# Patient Record
Sex: Male | Born: 1940 | Race: Black or African American | Hispanic: No | Smoking: Never smoker
Health system: Southern US, Community
[De-identification: ages and names within clinical notes are randomized; demographics above are authoritative.]

## PROBLEM LIST (undated history)

## (undated) DIAGNOSIS — D649 Anemia, unspecified: Secondary | ICD-10-CM

## (undated) DIAGNOSIS — I2699 Other pulmonary embolism without acute cor pulmonale: Secondary | ICD-10-CM

## (undated) DIAGNOSIS — I10 Essential (primary) hypertension: Secondary | ICD-10-CM

## (undated) DIAGNOSIS — C801 Malignant (primary) neoplasm, unspecified: Secondary | ICD-10-CM

## (undated) DIAGNOSIS — C61 Malignant neoplasm of prostate: Secondary | ICD-10-CM

## (undated) HISTORY — PX: HERNIA REPAIR: SHX51

## (undated) HISTORY — PX: COLONOSCOPY: SHX174

---

## 2002-02-10 ENCOUNTER — Encounter: Admission: RE | Admit: 2002-02-10 | Discharge: 2002-02-10 | Payer: Self-pay | Admitting: Occupational Medicine

## 2002-02-10 ENCOUNTER — Encounter: Payer: Self-pay | Admitting: Occupational Medicine

## 2016-09-08 ENCOUNTER — Encounter (HOSPITAL_BASED_OUTPATIENT_CLINIC_OR_DEPARTMENT_OTHER): Payer: Self-pay | Admitting: Emergency Medicine

## 2016-09-08 ENCOUNTER — Inpatient Hospital Stay (HOSPITAL_BASED_OUTPATIENT_CLINIC_OR_DEPARTMENT_OTHER)
Admission: EM | Admit: 2016-09-08 | Discharge: 2016-09-12 | DRG: 175 | Disposition: A | Discharge status: Home / Self Care | Payer: Medicare Other | Attending: Internal Medicine | Admitting: Internal Medicine

## 2016-09-08 ENCOUNTER — Emergency Department (HOSPITAL_BASED_OUTPATIENT_CLINIC_OR_DEPARTMENT_OTHER): Payer: Medicare Other

## 2016-09-08 DIAGNOSIS — M1990 Unspecified osteoarthritis, unspecified site: Secondary | ICD-10-CM | POA: Diagnosis present

## 2016-09-08 DIAGNOSIS — I420 Dilated cardiomyopathy: Secondary | ICD-10-CM | POA: Diagnosis present

## 2016-09-08 DIAGNOSIS — E876 Hypokalemia: Secondary | ICD-10-CM | POA: Diagnosis not present

## 2016-09-08 DIAGNOSIS — J9601 Acute respiratory failure with hypoxia: Secondary | ICD-10-CM | POA: Diagnosis not present

## 2016-09-08 DIAGNOSIS — D62 Acute posthemorrhagic anemia: Secondary | ICD-10-CM | POA: Diagnosis present

## 2016-09-08 DIAGNOSIS — R0902 Hypoxemia: Secondary | ICD-10-CM | POA: Diagnosis not present

## 2016-09-08 DIAGNOSIS — R000 Tachycardia, unspecified: Secondary | ICD-10-CM | POA: Diagnosis not present

## 2016-09-08 DIAGNOSIS — Z8546 Personal history of malignant neoplasm of prostate: Secondary | ICD-10-CM

## 2016-09-08 DIAGNOSIS — Z923 Personal history of irradiation: Secondary | ICD-10-CM | POA: Diagnosis not present

## 2016-09-08 DIAGNOSIS — I10 Essential (primary) hypertension: Secondary | ICD-10-CM | POA: Diagnosis not present

## 2016-09-08 DIAGNOSIS — Z79899 Other long term (current) drug therapy: Secondary | ICD-10-CM | POA: Diagnosis not present

## 2016-09-08 DIAGNOSIS — I2699 Other pulmonary embolism without acute cor pulmonale: Secondary | ICD-10-CM | POA: Diagnosis not present

## 2016-09-08 DIAGNOSIS — R0602 Shortness of breath: Secondary | ICD-10-CM | POA: Diagnosis not present

## 2016-09-08 DIAGNOSIS — Z86711 Personal history of pulmonary embolism: Secondary | ICD-10-CM

## 2016-09-08 DIAGNOSIS — I5022 Chronic systolic (congestive) heart failure: Secondary | ICD-10-CM | POA: Diagnosis not present

## 2016-09-08 DIAGNOSIS — I110 Hypertensive heart disease with heart failure: Secondary | ICD-10-CM | POA: Diagnosis present

## 2016-09-08 DIAGNOSIS — D649 Anemia, unspecified: Secondary | ICD-10-CM | POA: Diagnosis not present

## 2016-09-08 HISTORY — DX: Malignant (primary) neoplasm, unspecified: C80.1

## 2016-09-08 HISTORY — DX: Essential (primary) hypertension: I10

## 2016-09-08 HISTORY — DX: Other pulmonary embolism without acute cor pulmonale: I26.99

## 2016-09-08 LAB — PROTIME-INR
INR: 1.28
PROTHROMBIN TIME: 16.1 s — AB (ref 11.4–15.2)

## 2016-09-08 LAB — CBC WITH DIFFERENTIAL/PLATELET
BASOS ABS: 0 10*3/uL (ref 0.0–0.1)
Basophils Relative: 0 %
EOS ABS: 0.2 10*3/uL (ref 0.0–0.7)
Eosinophils Relative: 2 %
HCT: 27.5 % — ABNORMAL LOW (ref 39.0–52.0)
HEMOGLOBIN: 8.8 g/dL — AB (ref 13.0–17.0)
LYMPHS PCT: 21 %
Lymphs Abs: 1.6 10*3/uL (ref 0.7–4.0)
MCH: 21.5 pg — ABNORMAL LOW (ref 26.0–34.0)
MCHC: 32 g/dL (ref 30.0–36.0)
MCV: 67.1 fL — ABNORMAL LOW (ref 78.0–100.0)
MONO ABS: 1 10*3/uL (ref 0.1–1.0)
Monocytes Relative: 13 %
NEUTROS ABS: 4.8 10*3/uL (ref 1.7–7.7)
NEUTROS PCT: 64 %
PLATELETS: 315 10*3/uL (ref 150–400)
RBC: 4.1 MIL/uL — ABNORMAL LOW (ref 4.22–5.81)
RDW: 17.5 % — ABNORMAL HIGH (ref 11.5–15.5)
WBC: 7.6 10*3/uL (ref 4.0–10.5)

## 2016-09-08 LAB — BASIC METABOLIC PANEL
ANION GAP: 8 (ref 5–15)
BUN: 16 mg/dL (ref 6–20)
CALCIUM: 9.8 mg/dL (ref 8.9–10.3)
CO2: 21 mmol/L — ABNORMAL LOW (ref 22–32)
Chloride: 107 mmol/L (ref 101–111)
Creatinine, Ser: 1.08 mg/dL (ref 0.61–1.24)
GFR calc Af Amer: >60 mL/min (ref >60)
Glucose, Bld: 152 mg/dL — ABNORMAL HIGH (ref 65–99)
Potassium: 3.7 mmol/L (ref 3.5–5.1)
SODIUM: 136 mmol/L (ref 135–145)

## 2016-09-08 LAB — GLUCOSE, CAPILLARY: GLUCOSE-CAPILLARY: 108 mg/dL — AB (ref 65–99)

## 2016-09-08 LAB — PSA: PSA: 0.02 ng/mL (ref 0.00–4.00)

## 2016-09-08 LAB — HEMOGLOBIN AND HEMATOCRIT, BLOOD
HEMATOCRIT: 26.4 % — AB (ref 39.0–52.0)
HEMOGLOBIN: 8.2 g/dL — AB (ref 13.0–17.0)

## 2016-09-08 LAB — HEPARIN LEVEL (UNFRACTIONATED): Heparin Unfractionated: 0.51 IU/mL (ref 0.30–0.70)

## 2016-09-08 LAB — I-STAT ARTERIAL BLOOD GAS, ED
Acid-base deficit: 5 mmol/L — ABNORMAL HIGH (ref 0.0–2.0)
Bicarbonate: 18.2 mmol/L — ABNORMAL LOW (ref 20.0–28.0)
O2 SAT: 90 %
PH ART: 7.44 (ref 7.350–7.450)
PO2 ART: 56 mmHg — AB (ref 83.0–108.0)
Patient temperature: 98.5
TCO2: 19 mmol/L (ref 0–100)
pCO2 arterial: 26.8 mmHg — ABNORMAL LOW (ref 32.0–48.0)

## 2016-09-08 LAB — TROPONIN I: Troponin I: 0.16 ng/mL (ref <0.03)

## 2016-09-08 LAB — OCCULT BLOOD X 1 CARD TO LAB, STOOL: FECAL OCCULT BLD: NEGATIVE

## 2016-09-08 LAB — BRAIN NATRIURETIC PEPTIDE: B Natriuretic Peptide: 319.9 pg/mL — ABNORMAL HIGH (ref 0.0–100.0)

## 2016-09-08 LAB — MRSA PCR SCREENING: MRSA by PCR: NEGATIVE

## 2016-09-08 MED ORDER — SODIUM CHLORIDE 0.9% FLUSH
3.0000 mL | Freq: Two times a day (BID) | INTRAVENOUS | Status: DC
  Filled 2016-09-08: qty 3

## 2016-09-08 MED ORDER — HEPARIN (PORCINE) IN NACL 100-0.45 UNIT/ML-% IJ SOLN
1400.0000 [IU]/h | INTRAMUSCULAR | Status: DC
  Administered 2016-09-08 – 2016-09-12 (×6): 1400 [IU]/h via INTRAVENOUS
  Filled 2016-09-08 (×6): qty 250

## 2016-09-08 MED ORDER — SODIUM CHLORIDE 0.9 % IV BOLUS (SEPSIS)
1000.0000 mL | Freq: Once | INTRAVENOUS | Status: AC
  Administered 2016-09-08: 1000 mL via INTRAVENOUS

## 2016-09-08 MED ORDER — SODIUM CHLORIDE 0.9% FLUSH
3.0000 mL | INTRAVENOUS | Status: DC | PRN
  Filled 2016-09-08: qty 3

## 2016-09-08 MED ORDER — SODIUM CHLORIDE 0.9 % IV SOLN: 250.0000 mL | INTRAVENOUS | Status: DC | PRN

## 2016-09-08 MED ORDER — CARVEDILOL 12.5 MG PO TABS
12.5000 mg | ORAL_TABLET | Freq: Two times a day (BID) | ORAL | Status: DC
  Administered 2016-09-08 – 2016-09-12 (×9): 12.5 mg via ORAL
  Filled 2016-09-08 (×11): qty 1

## 2016-09-08 MED ORDER — IOPAMIDOL (ISOVUE-370) INJECTION 76%
100.0000 mL | Freq: Once | INTRAVENOUS | Status: AC | PRN
  Administered 2016-09-08: 100 mL via INTRAVENOUS

## 2016-09-08 NOTE — ED Triage Notes (Addendum)
SOB , worse during the night . Taken off Coumadin for PE on 08/31/16 due to rectal bleeding. Denies chest pain or rectal bleeding at present.

## 2016-09-08 NOTE — Progress Notes (Signed)
Notified for consultation,  patient with earlier dark stools,  extension of pulmonary emboli on Coumadin by CT scan and worsening respiratory distress. Hemoglobin 8.8 with no prior baseline and stool heme negative on admission. We'll see tomorrow for possible previous blood loss and ongoing need for anticoagulation. Unless current evidence of GI bleeding no contraindication to anticoagulation given pulmonary status. Please call if further input needed sooner.

## 2016-09-08 NOTE — ED Notes (Signed)
Patient denies pain and is resting comfortably.  

## 2016-09-08 NOTE — H&P (Signed)
History and Physical    Francisco Rogers OZH:[id number] DOB: April 13, 1941 DOA: 09/08/2016  PCP: No primary care provider on file.  Patient coming from: [facility]  Chief Complaint: worsening exertional dyspnea  HPI: Francisco Rogers is a 76 y.o. male with medical history significant for bilateral PE diagnosed in December of 2017, HTN, Osteoarthritis and prostate CA cancer presented to the [facility] with c/o progressive SOB over the period of 3-4 days.  Patient was placed on Coumadin after initial diagnosis of bilateral PE, but it was discontinued d/t him developing rectal bleeding approximately 8 days ago having few episodes of bloody stools. The colonoscopy was scheduled with Dr. Hassell Done on 09/10/2016, however d/t worsening of dyspnea patient presented to the ED for evaluation Patient denied chest pain, palpitations, abdominal pain, nausea, diarrhea or any more bloody stools since the discontinuation of Coumadin  ED Course: on arrival patient was afebrile, tachycardic with HR 114-138, tachypneic, normotensive, but SpOO2 dropped to 75 % on RA that quickly improved after O2 administration ABG showed normal pH, normal pCO2 26.8 mmHg and pO2 56 mmHg Due respiratory failure with hypoxemia, patient underwent CT this morning showing recurrent bilateral central and segmental acute PE with CT evidence of right heart strain (RV/LV Ratio = 1.5) consistent with at least submassive (intermediate risk) PE Troponin was elevated at 0.16 and BNP 319.9 Hgb today was 8.8 and no other Hg for comparison in our system  Fecal occult blood test was negative  Review of Systems: As per HPI otherwise 10 point review of systems negative.   Ambulatory Status: Independent  Past Medical History:  Diagnosis Date  . Cancer ([place])   . Hypertension   . Pulmonary embolism [facility])     Past Surgical History:  Procedure Laterality Date  . HERNIA REPAIR      Social History   Social History  .  Marital status: Married    Spouse name: N/A  . Number of children: N/A  . Years of education: N/A   Occupational History  . Not on file.   Social History Main Topics  . Smoking status: Never Smoker  . Smokeless tobacco: Never Used  . Alcohol use Yes     Comment: socially  . Drug use: No  . Sexual activity: Not on file   Other Topics Concern  . Not on file   Social History Narrative  . No narrative on file    No Known Allergies  No family history on file.  Prior to Admission medications   Medication Sig Start Date End Date Taking? Authorizing Provider  carvedilol (COREG) 12.5 MG tablet Take 12.5 mg by mouth 2 (two) times daily with a meal.   Yes Historical Provider, MD    Physical Exam: Vitals:   09/08/16 1007 09/08/16 1017 09/08/16 1022 09/08/16 1030  BP:  (!) 133/115  132/93  Pulse: 115 115  115  Resp: 21 23  23   Temp:   97.8 F (36.6 C)   TempSrc:   Oral   SpO2: 91% 91%  91%  Weight:      Height:         General: Appears calm and comfortable Eyes: PERRLA, EOMI, normal lids, iris ENT:  grossly normal hearing, lips & tongue, mucous membranes moist and intact Neck: no lymphoadenopathy, masses or thyromegaly Cardiovascular: RRR, no m/r/g. No JVD, carotid bruits. No LE edema.  Respiratory: bilateral no wheezes, rales, rhonchi or cracles. Normal respiratory effort. No accessory muscle use  observed Abdomen: soft, non-tender, non-distended, no organomegaly or masses appreciated. BS present in all quadrants Skin: no rash, ulcers or induration seen on limited exam Musculoskeletal: grossly normal tone BUE/BLE, good ROM, no bony abnormality or joint deformities observed Psychiatric: grossly normal mood and affect, speech fluent and appropriate, alert and oriented x3 Neurologic: CN II-XII grossly intact, moves all extremities in coordinated fashion, sensation intact  Labs on Admission: I have personally reviewed following labs and imaging studies  CBC,  BMP  GFR: Estimated Creatinine Clearance: 68.5 mL/min (by C-G formula based on SCr of 1.08 mg/dL).   Creatinine Clearance: Estimated Creatinine Clearance: 68.5 mL/min (by C-G formula based on SCr of 1.08 mg/dL).    Radiological Exams on Admission: Ct Angio Chest Pe W And/or Wo Contrast  Result Date: 09/08/2016 CLINICAL DATA:  Pt with increased SOB, recent hx PE on blood thinners but stopped due to some rectal bleeding, no chest pain HTN, prostate cancer- seeds, PE Bun/creat within normal limits EXAM: CT ANGIOGRAPHY CHEST WITH CONTRAST TECHNIQUE: Multidetector CT imaging of the chest was performed using the standard protocol during bolus administration of intravenous contrast. Multiplanar CT image reconstructions and MIPs were obtained to evaluate the vascular anatomy. CONTRAST:  100 mL isovue 370, pt nauseous after scan COMPARISON:  06/19/2016 FINDINGS: Cardiovascular: Left arm IV contrast administration. Innominate vein and SVC patent. Mild right atrial enlargement. Dilated right ventricle, RV/ LV ratio 1.5. There is good contrast opacification of pulmonary artery. Bilateral central and segmental pulmonary emboli involving upper and lower lobe branches. Some are clearly new since previous examination (which also demonstrated bilateral PE). Patent bilateral pulmonary veins drain into the left atrium. Adequate contrast opacification of the thoracic aorta with no evidence of dissection, aneurysm, or stenosis. There is classic 3-vessel brachiocephalic arch anatomy without proximal stenosis. Scattered plaque in the distal arch. Mediastinum/Nodes: Enlarged 13 mm left hilar node, along with subcentimeter prevascular, pretracheal, and precarinal lymph nodes as before. No mediastinal hematoma. No pericardial effusion. Lungs/Pleura: Bilateral lower lobe Infrahilar airspace consolidation right worse than left, new since prior study. Patchy infiltrate in the medial right upper lobe suprahilar region, new since  previous. No pleural effusion. No pneumothorax. Upper Abdomen: No acute findings.  Stable probable hepatic cysts. Musculoskeletal: Mild spondylitic changes in the mid and lower thoracic spine. No fracture or or worrisome bone lesion. Review of the MIP images confirms the above findings. IMPRESSION: 1. Positive for recurrent bilateral central and segmental acute PE with CT evidence of right heart strain (RV/LV Ratio = 1.5) consistent with at least submassive (intermediate risk) PE. The presence of right heart strain has been associated with an increased risk of morbidity and mortality. Please activate Code PE by paging [phone]. Critical Value/emergent results were called by telephone at the time of interpretation on 09/08/2016 at 9:04 am to Dr. Sherwood Gambler , who verbally acknowledged these results. 2. Patchy perihilar airspace opacities in both lower lobes and medial right upper lobe, more characteristic of infectious/inflammatory process or atypical edema than pulmonary infarct. 3. Borderline enlarged left hilar lymph node, possibly reactive but nonspecific. Electronically Signed   By: Lucrezia Europe M.D.   On: 09/08/2016 09:04   Dg Chest Port 1 View  Result Date: 09/08/2016 CLINICAL DATA:  Shortness of Breath EXAM: PORTABLE CHEST 1 VIEW COMPARISON:  06/19/2016 FINDINGS: The heart size and mediastinal contours are within normal limits. Both lungs are clear. The visualized skeletal structures are unremarkable. IMPRESSION: No active disease. Electronically Signed   By: Linus Mako.D.  On: 09/08/2016 08:34    EKG: Independently reviewed - SR, no acute ischemic changes   Assessment/Plan Active Problems:   Bilateral pulmonary embolism (HCC)   Acute bilateral PE - currently is on Heparin drip Continue same and observe for recurrent signs of bleeding Consider transition to ral anticoagulation after seen by GI  Obtain venous doppler US to r/o DVT  GI bleeding with acute blood loss anemia Currently  fecal occult blood test is negative GI evaluation requested Keep NPO for possible procedure Continue to monitor [place]  Hypertension - currently stable Continue home medication and adjust the doses if needed depending on the BP readings  History of prostate CA treated with seed radiation in 2014 Patient has been followed in [place] PSA - cancer recurrence could be a causative factor for PE  DVT prophylaxis: Heparin Code Status: Full  Family Communication: at bedside Disposition Plan: ICU Consults called: GI  Admission status: Inpatient    York Grice, [place] Pager: [phone] [facility]  If 7PM-7AM, please contact night-coverage [email or web address] Password [email or web address]  09/08/2016, 12:13 PM

## 2016-09-08 NOTE — ED Notes (Signed)
Troponin 0.16 results given to ED MD

## 2016-09-08 NOTE — ED Notes (Signed)
Family at bedside. 

## 2016-09-08 NOTE — ED Notes (Signed)
Pt on cardiac monitor and automatic VS 

## 2016-09-08 NOTE — Progress Notes (Signed)
ANTICOAGULATION CONSULT NOTE - Initial Consult  Pharmacy Consult for Heparin Indication: pulmonary embolus  No Known Allergies  Patient Measurements: Height: 5\' 11"  (180.3 cm) Weight: 217 lb (98.4 kg) IBW/kg (Calculated) : 75.3 Heparin Dosing Weight: 94  Vital Signs: Temp: 98.5 F (36.9 C) (03/10 0751) Temp Source: Oral (03/10 0751) BP: 143/91 (03/10 0845) Pulse Rate: 122 (03/10 0845)  Labs:  Recent Labs  09/08/16 0755  HGB 8.8*  HCT 27.5*  PLT 315  LABPROT 16.1*  INR 1.28  CREATININE 1.08  TROPONINI 0.16*    Estimated Creatinine Clearance: 68.5 mL/min (by C-G formula based on SCr of 1.08 mg/dL).   Medical History: Past Medical History:  Diagnosis Date  . Cancer ([place])   . Hypertension   . Pulmonary embolism [facility])     Assessment: 76yo male presenting to [facility] with worsening SOB and new PEs on CT.  Pt was previously on Coumadin for PE, but this was stopped on 3/2 due to rectal bleeding.  I have verified with DrGoldston that patient is on no other anticoagulation at this time.  FOB is negative this AM.  Hg was 11 in December, no record of recent CBC.  Goal of Therapy:  Heparin level 0.3-0.7 units/ml Monitor platelets by anticoagulation protocol: Yes   Plan:  Heparin 1400 units/hr, no bolus due to recent GIB Heparin level 8hr Daily heparin level and CBC Watch for s/s of bleeding.   Gracy Bruins, PharmD Clinical Pharmacist [facility]

## 2016-09-08 NOTE — ED Notes (Signed)
Portable CXR done.

## 2016-09-08 NOTE — ED Provider Notes (Signed)
[place] DEPT [facility] Provider Note   CSN: [id number] Arrival date & time: 09/08/16  2500     History   Chief Complaint Chief Complaint  Patient presents with  . Shortness of Breath    HPI Francisco Rogers is a 76 y.o. male.  HPI  76 year old male with a history of bilateral pulmonary emboli in December 2017 presents with shortness of breath over the last 3 or 4 days. Progressively worsening until last night when it became much more severe. He saw his doctor yesterday, no new interventions performed. Patient used to be on warfarin for the PE, but was taken off 8 days ago due to starting to have rectal bleeding (3-4 episodes for 1 day 10 days ago). He states he has been scheduled for a colonoscopy. He denies any chest pain or cough. No leg swelling. Yesterday when he is walking around his legs were tingly in his bilateral thighs.  Past Medical History:  Diagnosis Date  . Cancer ([place])   . Hypertension   . Pulmonary embolism [facility])     Patient Active Problem List   Diagnosis Date Noted  . Bilateral pulmonary embolism ([place]) 09/08/2016    Past Surgical History:  Procedure Laterality Date  . HERNIA REPAIR         Home Medications    Prior to Admission medications   Medication Sig Start Date End Date Taking? Authorizing Provider  carvedilol (COREG) 12.5 MG tablet Take 12.5 mg by mouth 2 (two) times daily with a meal.   Yes Historical Provider, MD    Family History No family history on file.  Social History Social History  Substance Use Topics  . Smoking status: Never Smoker  . Smokeless tobacco: Never Used  . Alcohol use Yes     Comment: socially     Allergies   Patient has no known allergies.   Review of Systems Review of Systems  Constitutional: Negative for fever.  Respiratory: Positive for shortness of breath. Negative for cough.   Cardiovascular: Negative for chest pain and leg swelling.  All other systems reviewed and are negative.    Physical  Exam Updated Vital Signs BP 132/93   Pulse 115   Temp 97.8 F (36.6 C) (Oral)   Resp 23   Ht 5\' 11"  (1.803 m)   Wt 217 lb (98.4 kg)   SpO2 91%   BMI 30.27 kg/m   Physical Exam  Constitutional: He is oriented to person, place, and time. He appears well-developed and well-nourished.  HENT:  Head: Normocephalic and atraumatic.  Right Ear: External ear normal.  Left Ear: External ear normal.  Nose: Nose normal.  Eyes: Right eye exhibits no discharge. Left eye exhibits no discharge.  Neck: Neck supple.  Cardiovascular: Regular rhythm and normal heart sounds.  Tachycardia present.   Pulmonary/Chest: Effort normal and breath sounds normal. Tachypnea noted. No respiratory distress.  Abdominal: Soft. There is no tenderness.  Musculoskeletal: He exhibits no edema.  Neurological: He is alert and oriented to person, place, and time.  Skin: Skin is warm and dry. He is not diaphoretic.  Nursing note and vitals reviewed.    ED Treatments / Results  Labs (all labs ordered are listed, but only abnormal results are displayed) Labs Reviewed  BASIC METABOLIC PANEL - Abnormal; Notable for the following:       Result Value   CO2 21 (*)    Glucose, Bld 152 (*)    All other components within normal limits  CBC WITH  DIFFERENTIAL/PLATELET - Abnormal; Notable for the following:    RBC 4.10 (*)    Hemoglobin 8.8 (*)    HCT 27.5 (*)    MCV 67.1 (*)    MCH 21.5 (*)    RDW 17.5 (*)    All other components within normal limits  PROTIME-INR - Abnormal; Notable for the following:    Prothrombin Time 16.1 (*)    All other components within normal limits  TROPONIN I - Abnormal; Notable for the following:    Troponin I 0.16 (*)    All other components within normal limits  BRAIN NATRIURETIC PEPTIDE - Abnormal; Notable for the following:    B Natriuretic Peptide 319.9 (*)    All other components within normal limits  I-STAT ARTERIAL BLOOD GAS, ED - Abnormal; Notable for the following:    pCO2  arterial 26.8 (*)    pO2, Arterial 56.0 (*)    Bicarbonate 18.2 (*)    Acid-base deficit 5.0 (*)    All other components within normal limits  OCCULT BLOOD X 1 CARD TO LAB, STOOL  HEPARIN LEVEL (UNFRACTIONATED)  HEMOGLOBIN A1C  OCCULT BLOOD X 1 CARD TO LAB, STOOL  HEMOGLOBIN AND HEMATOCRIT, BLOOD  HEMOGLOBIN AND HEMATOCRIT, BLOOD  HEMOGLOBIN AND HEMATOCRIT, BLOOD    EKG  EKG Interpretation  Date/Time:  Saturday September 08 2016 07:48:07 EST Ventricular Rate:  127 PR Interval:    QRS Duration: 80 QT Interval:  312 QTC Calculation: 454 R Axis:   31 Text Interpretation:  Sinus tachycardia Probable left atrial enlargement Borderline T abnormalities, diffuse leads No old tracing to compare Confirmed by Sulay Brymer MD, Laurynn Mccorvey [phone]) on 09/08/2016 8:02:41 AM       Radiology Ct Angio Chest Pe W And/or Wo Contrast  Result Date: 09/08/2016 CLINICAL DATA:  Pt with increased SOB, recent hx PE on blood thinners but stopped due to some rectal bleeding, no chest pain HTN, prostate cancer- seeds, PE Bun/creat within normal limits EXAM: CT ANGIOGRAPHY CHEST WITH CONTRAST TECHNIQUE: Multidetector CT imaging of the chest was performed using the standard protocol during bolus administration of intravenous contrast. Multiplanar CT image reconstructions and MIPs were obtained to evaluate the vascular anatomy. CONTRAST:  100 mL isovue 370, pt nauseous after scan COMPARISON:  06/19/2016 FINDINGS: Cardiovascular: Left arm IV contrast administration. Innominate vein and SVC patent. Mild right atrial enlargement. Dilated right ventricle, RV/ LV ratio 1.5. There is good contrast opacification of pulmonary artery. Bilateral central and segmental pulmonary emboli involving upper and lower lobe branches. Some are clearly new since previous examination (which also demonstrated bilateral PE). Patent bilateral pulmonary veins drain into the left atrium. Adequate contrast opacification of the thoracic aorta with no evidence  of dissection, aneurysm, or stenosis. There is classic 3-vessel brachiocephalic arch anatomy without proximal stenosis. Scattered plaque in the distal arch. Mediastinum/Nodes: Enlarged 13 mm left hilar node, along with subcentimeter prevascular, pretracheal, and precarinal lymph nodes as before. No mediastinal hematoma. No pericardial effusion. Lungs/Pleura: Bilateral lower lobe Infrahilar airspace consolidation right worse than left, new since prior study. Patchy infiltrate in the medial right upper lobe suprahilar region, new since previous. No pleural effusion. No pneumothorax. Upper Abdomen: No acute findings.  Stable probable hepatic cysts. Musculoskeletal: Mild spondylitic changes in the mid and lower thoracic spine. No fracture or or worrisome bone lesion. Review of the MIP images confirms the above findings. IMPRESSION: 1. Positive for recurrent bilateral central and segmental acute PE with CT evidence of right heart strain (RV/LV Ratio = 1.5)  consistent with at least submassive (intermediate risk) PE. The presence of right heart strain has been associated with an increased risk of morbidity and mortality. Please activate Code PE by paging [phone]. Critical Value/emergent results were called by telephone at the time of interpretation on 09/08/2016 at 9:04 am to Dr. Sherwood Gambler , who verbally acknowledged these results. 2. Patchy perihilar airspace opacities in both lower lobes and medial right upper lobe, more characteristic of infectious/inflammatory process or atypical edema than pulmonary infarct. 3. Borderline enlarged left hilar lymph node, possibly reactive but nonspecific. Electronically Signed   By: Lucrezia Europe M.D.   On: 09/08/2016 09:04   Dg Chest Port 1 View  Result Date: 09/08/2016 CLINICAL DATA:  Shortness of Breath EXAM: PORTABLE CHEST 1 VIEW COMPARISON:  06/19/2016 FINDINGS: The heart size and mediastinal contours are within normal limits. Both lungs are clear. The visualized  skeletal structures are unremarkable. IMPRESSION: No active disease. Electronically Signed   By: Inez Catalina M.D.   On: 09/08/2016 08:34    Procedures Procedures (including critical care time)  CRITICAL CARE Performed by: Sherwood Gambler T   Total critical care time: 35 minutes  Critical care time was exclusive of separately billable procedures and treating other patients.  Critical care was necessary to treat or prevent imminent or life-threatening deterioration.  Critical care was time spent personally by me on the following activities: development of treatment plan with patient and/or surrogate as well as nursing, discussions with consultants, evaluation of patient's response to treatment, examination of patient, obtaining history from patient or surrogate, ordering and performing treatments and interventions, ordering and review of laboratory studies, ordering and review of radiographic studies, pulse oximetry and re-evaluation of patient's condition.   Medications Ordered in ED Medications  heparin ADULT infusion 100 units/mL (25000 units/279mL sodium chloride 0.45%) (1,400 Units/hr Intravenous New Bag/Given 09/08/16 0912)  sodium chloride flush (NS) 0.9 % injection 3 mL (not administered)  sodium chloride flush (NS) 0.9 % injection 3 mL (not administered)  sodium chloride flush (NS) 0.9 % injection 3 mL (not administered)  0.9 %  sodium chloride infusion (not administered)  sodium chloride 0.9 % bolus 1,000 mL (0 mLs Intravenous Stopped 09/08/16 1012)  iopamidol (ISOVUE-370) 76 % injection 100 mL (100 mLs Intravenous Contrast Given 09/08/16 0833)     Initial Impression / Assessment and Plan / ED Course  I have reviewed the triage vital signs and the nursing notes.  Pertinent labs & imaging results that were available during my care of the patient were reviewed by me and considered in my medical decision making (see chart for details).  Clinical Course as of Sep 09 1042  Sat  Sep 08, 2016  0746 Patient's initial O2 was 75% on room air. After being placed on 4 L nasal cannula he has steadily improved into the low 90s. His work of breathing has improved. He is quite tachycardic, will get ECG although this is probably reactive. Will get a portable chest x-ray but if this is negative as I suspect, he'll get a CT chest to evaluate for PE.  [SG]  0844 Multiple PE on my viewing, c/w primary concern, will start heparin  [SG]  0919 D/w Dr. Halford Chessman of ICU. Given recent GI bleed, not a candidate for EKOS. He is maintaining O2 on [place] (6L). WOB is improved. Recommends hospitalist admission and GI consult  [SG]  [phone] D/w Dr. Janne Napoleon. He needs stepdown or higher and given no stepdown beds, she asks for ICU to  admit. Will call Dr Halford Chessman back.  [SG]  [phone] D/w [facility], they state Dr. Halford Chessman states patient does not need ICU but can do stepdown overflow under hospitalist service. [facility] will talk to [facility]  [SG]  1005 Dr Janne Napoleon to admit to stepdown overflow. HR improving. Sats stable on O2. No distress or hypotension  [SG]    Clinical Course User Index [SG] Sherwood Gambler, MD    Final Clinical Impressions(s) / ED Diagnoses   Final diagnoses:  Pulmonary embolism, bilateral ([place])  Hypoxia    New Prescriptions New Prescriptions   No medications on file     Sherwood Gambler, MD 09/08/16 1045

## 2016-09-08 NOTE — Progress Notes (Signed)
ANTICOAGULATION CONSULT NOTE  Pharmacy Consult for Heparin Indication: pulmonary embolus  No Known Allergies  Patient Measurements: Height: 5\' 11"  (180.3 cm) Weight: 217 lb (98.4 kg) IBW/kg (Calculated) : 75.3 Heparin Dosing Weight: 94  Vital Signs: Temp: 97.5 F (36.4 C) (03/10 1650) Temp Source: Oral (03/10 1650) BP: 135/79 (03/10 1600) Pulse Rate: 112 (03/10 1600)  Labs:  Recent Labs  09/08/16 0755 09/08/16 1801  HGB 8.8* 8.2*  HCT 27.5* 26.4*  PLT 315  --   LABPROT 16.1*  --   INR 1.28  --   HEPARINUNFRC  --  0.51  CREATININE 1.08  --   TROPONINI 0.16*  --     Estimated Creatinine Clearance: 68.5 mL/min (by C-G formula based on SCr of 1.08 mg/dL).   Medical History: Past Medical History:  Diagnosis Date  . Cancer ([place])   . Hypertension   . Pulmonary embolism [facility])     Assessment: 76yo male presenting to [facility] with worsening SOB and new PEs on CT.  Pt was previously on Coumadin for PE, but this was stopped on 3/2 due to rectal bleeding. -Initial heparin level= 0.51 and at goal  Goal of Therapy:  Heparin level 0.3-0.7 units/ml Monitor platelets by anticoagulation protocol: Yes   Plan:  -No heparin changes needed -Daily heparin level and CBC  Hildred Laser, Pharm D 09/08/2016 6:55 PM

## 2016-09-08 NOTE — ED Notes (Addendum)
Patient is sent to [facility] by Dr. Regenia Skeeter and Dr. Jerilynn Mages. Francisco Rogers is receiving patient to  Room 2M15  via [place].

## 2016-09-08 NOTE — ED Notes (Signed)
Patient transported to CT 

## 2016-09-09 ENCOUNTER — Inpatient Hospital Stay (HOSPITAL_COMMUNITY): Payer: Medicare Other

## 2016-09-09 DIAGNOSIS — R0602 Shortness of breath: Secondary | ICD-10-CM

## 2016-09-09 DIAGNOSIS — I2699 Other pulmonary embolism without acute cor pulmonale: Secondary | ICD-10-CM

## 2016-09-09 DIAGNOSIS — Z86711 Personal history of pulmonary embolism: Secondary | ICD-10-CM

## 2016-09-09 LAB — BASIC METABOLIC PANEL
ANION GAP: 6 (ref 5–15)
BUN: 10 mg/dL (ref 6–20)
CALCIUM: 9.2 mg/dL (ref 8.9–10.3)
CO2: 22 mmol/L (ref 22–32)
Chloride: 110 mmol/L (ref 101–111)
Creatinine, Ser: 0.81 mg/dL (ref 0.61–1.24)
GFR calc Af Amer: >60 mL/min (ref >60)
Glucose, Bld: 114 mg/dL — ABNORMAL HIGH (ref 65–99)
POTASSIUM: 3.4 mmol/L — AB (ref 3.5–5.1)
SODIUM: 138 mmol/L (ref 135–145)

## 2016-09-09 LAB — CBC
HCT: 26.1 % — ABNORMAL LOW (ref 39.0–52.0)
Hemoglobin: 8.1 g/dL — ABNORMAL LOW (ref 13.0–17.0)
MCH: 20.9 pg — AB (ref 26.0–34.0)
MCHC: 31 g/dL (ref 30.0–36.0)
MCV: 67.3 fL — ABNORMAL LOW (ref 78.0–100.0)
PLATELETS: 271 10*3/uL (ref 150–400)
RBC: 3.88 MIL/uL — AB (ref 4.22–5.81)
RDW: 17.1 % — AB (ref 11.5–15.5)
WBC: 8 10*3/uL (ref 4.0–10.5)

## 2016-09-09 LAB — HEPARIN LEVEL (UNFRACTIONATED): Heparin Unfractionated: 0.62 IU/mL (ref 0.30–0.70)

## 2016-09-09 LAB — ECHOCARDIOGRAM COMPLETE
Height: 71 in
Weight: 3382.4 oz

## 2016-09-09 LAB — MAGNESIUM: MAGNESIUM: 1.8 mg/dL (ref 1.7–2.4)

## 2016-09-09 LAB — PROTIME-INR
INR: 1.32
PROTHROMBIN TIME: 16.5 s — AB (ref 11.4–15.2)

## 2016-09-09 LAB — HEMOGLOBIN A1C
HEMOGLOBIN A1C: 5.6 % (ref 4.8–5.6)
MEAN PLASMA GLUCOSE: 114 mg/dL

## 2016-09-09 LAB — APTT: aPTT: 114 seconds — ABNORMAL HIGH (ref 24–36)

## 2016-09-09 NOTE — Progress Notes (Signed)
PROGRESS NOTE    Francisco Rogers  ZOX:[id number] DOB: Mar 15, 1941 DOA: 09/08/2016 PCP: No primary care provider on file.   Brief Narrative: Francisco Rogers is a 76 y.o. male with medical history significant for bilateral PE diagnosed in December of 2017, HTN, Osteoarthritis and prostate CA cancer   Assessment & Plan:   Active Problems:   Pulmonary embolism, bilateral (HCC)   Hypoxia   Tachycardia   Pulmonary embolism, bilateral Recurrent. Taken off coumadin for one week for GI bleed. Right heart strain read on CT -continue heparin -echocardiogram to assess right heart  Tachycardia Secondary to acute PE  Acute respiratory failure with hypoxia Dyspnea Secondary to PE -continue O2  GI bleeding Fecal occult test negative. GI consulted. No current evidence of bleeding, although hemoglobin stable -GI recommendations -repeat CBC  History of prostate cancer S/p seed radiation. Follows in [employer]. PSA 0.02  Hypokalemia Mild. -repeat BMP -add on magnesium   DVT prophylaxis: Heparin drip Code Status: Full code Family Communication: None at bedside Disposition Plan: Discharge home pending transition to oral anticoagulation   Consultants:   Gastroenterology, Dr. Amedeo Plenty  Procedures:   None  Antimicrobials:   None    Subjective: Patient reports dyspnea. No chest pain or abdominal pain. No palpitations at rest but present during ambulation.  Objective: Vitals:   09/08/16 2300 09/09/16 0300 09/09/16 0438 09/09/16 0733  BP: (!) 142/75 (!) 147/95  98/61  Pulse: (!) 118 (!) 110  (!) 102  Resp: (!) 29 (!) 25  (!) 25  Temp: 98.6 F (37 C) 98.1 F (36.7 C)  98.3 F (36.8 C)  TempSrc: Oral Oral  Oral  SpO2: 92% 98%  98%  Weight:   95.9 kg (211 lb 6.4 oz)   Height:        Intake/Output Summary (Last 24 hours) at 09/09/16 0827 Last data filed at 09/09/16 0800  Gross per 24 hour  Intake           1723.2 ml  Output              600 ml  Net           1123.2 ml    Filed Weights   09/08/16 0751 09/09/16 0438  Weight: 98.4 kg (217 lb) 95.9 kg (211 lb 6.4 oz)    Examination:  General exam: Appears calm and comfortable Respiratory system: Clear to auscultation. Respiratory effort normal. Cardiovascular system: S1 & S2 heard, tachycardia, regular rhythm. No murmurs. Gastrointestinal system: Abdomen is nondistended, soft and nontender. Normal bowel sounds heard. Central nervous system: Alert and oriented. No focal neurological deficits. Extremities: No edema. No calf tenderness Skin: No cyanosis. No rashes Psychiatry: Judgement and insight appear normal. Mood & affect appropriate.     Data Reviewed: I have personally reviewed following labs and imaging studies  CBC:  Recent Labs Lab 09/08/16 0755 09/08/16 1801 09/09/16 0530  WBC 7.6  --  8.0  NEUTROABS 4.8  --   --   HGB 8.8* 8.2* 8.1*  HCT 27.5* 26.4* 26.1*  MCV 67.1*  --  67.3*  PLT 315  --  811   Basic Metabolic Panel:  Recent Labs Lab 09/08/16 0755 09/09/16 0530  NA 136 138  K 3.7 3.4*  CL 107 110  CO2 21* 22  GLUCOSE 152* 114*  BUN 16 10  CREATININE 1.08 0.81  CALCIUM 9.8 9.2   GFR: Estimated Creatinine Clearance: 90.2 mL/min (by C-G formula based on SCr of 0.81 mg/dL). Liver  Function Tests: No results for input(s): AST, ALT, ALKPHOS, BILITOT, PROT, ALBUMIN in the last 168 hours. No results for input(s): LIPASE, AMYLASE in the last 168 hours. No results for input(s): AMMONIA in the last 168 hours. Coagulation Profile:  Recent Labs Lab 09/08/16 0755 09/09/16 0530  INR 1.28 1.32   Cardiac Enzymes:  Recent Labs Lab 09/08/16 0755  TROPONINI 0.16*   BNP (last 3 results) No results for input(s): PROBNP in the last 8760 hours. HbA1C: No results for input(s): HGBA1C in the last 72 hours. CBG:  Recent Labs Lab 09/08/16 1232  GLUCAP 108*   Lipid Profile: No results for input(s): CHOL, HDL, LDLCALC, TRIG, CHOLHDL, LDLDIRECT in the last 72  hours. Thyroid Function Tests: No results for input(s): TSH, T4TOTAL, FREET4, T3FREE, THYROIDAB in the last 72 hours. Anemia Panel: No results for input(s): VITAMINB12, FOLATE, FERRITIN, TIBC, IRON, RETICCTPCT in the last 72 hours. Sepsis Labs: No results for input(s): PROCALCITON, LATICACIDVEN in the last 168 hours.  Recent Results (from the past 240 hour(s))  MRSA PCR Screening     Status: None   Collection Time: 09/08/16 11:56 AM  Result Value Ref Range Status   MRSA by PCR NEGATIVE NEGATIVE Final    Comment:        The GeneXpert MRSA Assay (FDA approved for NASAL specimens only), is one component of a comprehensive MRSA colonization surveillance program. It is not intended to diagnose MRSA infection nor to guide or monitor treatment for MRSA infections.          Radiology Studies: Ct Angio Chest Pe W And/or Wo Contrast  Result Date: 09/08/2016 CLINICAL DATA:  Pt with increased SOB, recent hx PE on blood thinners but stopped due to some rectal bleeding, no chest pain HTN, prostate cancer- seeds, PE Bun/creat within normal limits EXAM: CT ANGIOGRAPHY CHEST WITH CONTRAST TECHNIQUE: Multidetector CT imaging of the chest was performed using the standard protocol during bolus administration of intravenous contrast. Multiplanar CT image reconstructions and MIPs were obtained to evaluate the vascular anatomy. CONTRAST:  100 mL isovue 370, pt nauseous after scan COMPARISON:  06/19/2016 FINDINGS: Cardiovascular: Left arm IV contrast administration. Innominate vein and SVC patent. Mild right atrial enlargement. Dilated right ventricle, RV/ LV ratio 1.5. There is good contrast opacification of pulmonary artery. Bilateral central and segmental pulmonary emboli involving upper and lower lobe branches. Some are clearly new since previous examination (which also demonstrated bilateral PE). Patent bilateral pulmonary veins drain into the left atrium. Adequate contrast opacification of the  thoracic aorta with no evidence of dissection, aneurysm, or stenosis. There is classic 3-vessel brachiocephalic arch anatomy without proximal stenosis. Scattered plaque in the distal arch. Mediastinum/Nodes: Enlarged 13 mm left hilar node, along with subcentimeter prevascular, pretracheal, and precarinal lymph nodes as before. No mediastinal hematoma. No pericardial effusion. Lungs/Pleura: Bilateral lower lobe Infrahilar airspace consolidation right worse than left, new since prior study. Patchy infiltrate in the medial right upper lobe suprahilar region, new since previous. No pleural effusion. No pneumothorax. Upper Abdomen: No acute findings.  Stable probable hepatic cysts. Musculoskeletal: Mild spondylitic changes in the mid and lower thoracic spine. No fracture or or worrisome bone lesion. Review of the MIP images confirms the above findings. IMPRESSION: 1. Positive for recurrent bilateral central and segmental acute PE with CT evidence of right heart strain (RV/LV Ratio = 1.5) consistent with at least submassive (intermediate risk) PE. The presence of right heart strain has been associated with an increased risk of morbidity and mortality. Please  activate Code PE by paging [phone]. Critical Value/emergent results were called by telephone at the time of interpretation on 09/08/2016 at 9:04 am to Dr. Sherwood Gambler , who verbally acknowledged these results. 2. Patchy perihilar airspace opacities in both lower lobes and medial right upper lobe, more characteristic of infectious/inflammatory process or atypical edema than pulmonary infarct. 3. Borderline enlarged left hilar lymph node, possibly reactive but nonspecific. Electronically Signed   By: Lucrezia Europe M.D.   On: 09/08/2016 09:04   Dg Chest Port 1 View  Result Date: 09/08/2016 CLINICAL DATA:  Shortness of Breath EXAM: PORTABLE CHEST 1 VIEW COMPARISON:  06/19/2016 FINDINGS: The heart size and mediastinal contours are within normal limits. Both lungs  are clear. The visualized skeletal structures are unremarkable. IMPRESSION: No active disease. Electronically Signed   By: Inez Catalina M.D.   On: 09/08/2016 08:34        Scheduled Meds: . carvedilol  12.5 mg Oral BID WC   Continuous Infusions: . heparin 1,400 Units/hr (09/09/16 0341)     LOS: 1 day     Cordelia Poche, MD [facility] 09/09/2016, 8:27 AM Pager: [phone]  If 7PM-7AM, please contact night-coverage [email or web address] Password [facility] 09/09/2016, 8:27 AM

## 2016-09-09 NOTE — Progress Notes (Signed)
  Echocardiogram 2D Echocardiogram has been performed.  Jennette Dubin 09/09/2016, 10:10 AM

## 2016-09-09 NOTE — Consult Note (Signed)
[facility] Consult Note  Referring Provider: No ref. provider found Primary Care Physician:  No primary care provider on file. Primary Gastroenterologist:  Dr.  Laurel Dimmer Complaint: Asked to see regarding previous rectal bleeding and need for Coumadin for pulmonary emboli HPI: Francisco Rogers is an 76 y.o. black male  with history of recent bilateral pulmonary emboli started on Coumadin, noticed transient rectal bleeding about 10 days ago and this was stopped. He came to the emergency room with progressive dyspnea and hypoxia and CT of the chest showed progression of bilateral pulmonary emboli. The patient has not seen any rectal bleeding since nor before. He has had multiple colonoscopies by Dr. Marcene Brawn in [place] and is known to have polyps but is not aware of diverticulosis. He denies any peptic ulcer disease dyspepsia or black stools.  Past Medical History:  Diagnosis Date  . Cancer ([place])   . Hypertension   . Pulmonary embolism [facility])     Past Surgical History:  Procedure Laterality Date  . HERNIA REPAIR      Medications Prior to Admission  Medication Sig Dispense Refill  . acetaminophen (TYLENOL) 325 MG tablet Take 325-650 mg by mouth every 6 (six) hours as needed (for pain).    . carvedilol (COREG) 12.5 MG tablet Take 12.5 mg by mouth 2 (two) times daily with a meal.    . Multiple Vitamins-Minerals (ONE-A-DAY MENS 50+ ADVANTAGE) TABS Take 1 tablet by mouth daily.    . metoCLOPramide (REGLAN) 5 MG tablet Take 5 mg by mouth at bedtime.    Marland Kitchen warfarin (COUMADIN) 5 MG tablet Take 5 mg by mouth at bedtime.      Allergies: No Known Allergies  No family history on file.  Social History:  reports that he has never smoked. He has never used smokeless tobacco. He reports that he drinks alcohol. He reports that he does not use drugs.  Review of Systems: negative except As above   Blood pressure 137/81, pulse (!) 103, temperature 98.3 F (36.8 C), temperature source  Oral, resp. rate (!) 28, height _0  (1.803 m), weight 95.9 kg (211 lb 6.4 oz), SpO2 96 %. Head: Normocephalic, without obvious abnormality, atraumatic Neck: no adenopathy, no carotid bruit, no JVD, supple, symmetrical, trachea midline and thyroid not enlarged, symmetric, no tenderness/mass/nodules Resp: clear to auscultation bilaterally Cardio: regular rate and rhythm, S1, S2 normal, no murmur, click, rub or gallop GI: Abdomen soft nondistended with normoactive bowel sounds megaly masses or guarding. Extremities: extremities normal, atraumatic, no cyanosis or edema  Results for orders placed or performed during the hospital encounter of 09/08/16 (from the past 48 hour(s))  Basic metabolic panel     Status: Abnormal   Collection Time: 09/08/16  7:55 AM  Result Value Ref Range   Sodium 136 135 - 145 mmol/L   Potassium 3.7 3.5 - 5.1 mmol/L   Chloride 107 101 - 111 mmol/L   CO2 21 (L) 22 - 32 mmol/L   Glucose, Bld 152 (H) 65 - 99 mg/dL   BUN 16 6 - 20 mg/dL   Creatinine, Ser 1.08 0.61 - 1.24 mg/dL   Calcium 9.8 8.9 - 10.3 mg/dL   GFR calc non Af Amer >60 >60 mL/min   GFR calc Af Amer >60 >60 mL/min    Comment: (NOTE) The eGFR has been calculated using the CKD EPI equation. This calculation has not been validated in all clinical situations. eGFR's persistently <60 mL/min signify possible Chronic Kidney Disease.  Anion gap 8 5 - 15  CBC with Differential     Status: Abnormal   Collection Time: 09/08/16  7:55 AM  Result Value Ref Range   WBC 7.6 4.0 - 10.5 K/uL   RBC 4.10 (L) 4.22 - 5.81 MIL/uL   Hemoglobin 8.8 (L) 13.0 - 17.0 g/dL   HCT 27.5 (L) 39.0 - 52.0 %   MCV 67.1 (L) 78.0 - 100.0 fL   MCH 21.5 (L) 26.0 - 34.0 pg   MCHC 32.0 30.0 - 36.0 g/dL   RDW 17.5 (H) 11.5 - 15.5 %   Platelets 315 150 - 400 K/uL   Neutrophils Relative % 64 %   Lymphocytes Relative 21 %   Monocytes Relative 13 %   Eosinophils Relative 2 %   Basophils Relative 0 %   Neutro Abs 4.8 1.7 - 7.7 K/uL    Lymphs Abs 1.6 0.7 - 4.0 K/uL   Monocytes Absolute 1.0 0.1 - 1.0 K/uL   Eosinophils Absolute 0.2 0.0 - 0.7 K/uL   Basophils Absolute 0.0 0.0 - 0.1 K/uL   RBC Morphology SCHISTOCYTES NOTED ON SMEAR     Comment: ELLIPTOCYTES POLYCHROMASIA PRESENT ACANTHOCYTES   Protime-INR     Status: Abnormal   Collection Time: 09/08/16  7:55 AM  Result Value Ref Range   Prothrombin Time 16.1 (H) 11.4 - 15.2 seconds   INR 1.28   Troponin I     Status: Abnormal   Collection Time: 09/08/16  7:55 AM  Result Value Ref Range   Troponin I 0.16 (HH) <0.03 ng/mL    Comment: CRITICAL RESULT CALLED TO, READ BACK BY AND VERIFIED WITH: CALLED TO S.GOUGE RN AT 0837 ON [id number] BY SROY   Brain natriuretic peptide     Status: Abnormal   Collection Time: 09/08/16  7:55 AM  Result Value Ref Range   B Natriuretic Peptide 319.9 (H) 0.0 - 100.0 pg/mL  I-Stat arterial blood gas, ED     Status: Abnormal   Collection Time: 09/08/16  8:15 AM  Result Value Ref Range   pH, Arterial 7.440 7.350 - 7.450   pCO2 arterial 26.8 (L) 32.0 - 48.0 mmHg   pO2, Arterial 56.0 (L) 83.0 - 108.0 mmHg   Bicarbonate 18.2 (L) 20.0 - 28.0 mmol/L   TCO2 19 0 - 100 mmol/L   O2 Saturation 90.0 %   Acid-base deficit 5.0 (H) 0.0 - 2.0 mmol/L   Patient temperature 98.5 F    Collection site RADIAL, ALLEN'S TEST ACCEPTABLE    Drawn by RT    Sample type ARTERIAL   Occult blood card to lab, stool RN will collect     Status: None   Collection Time: 09/08/16  8:49 AM  Result Value Ref Range   Fecal Occult Bld NEGATIVE NEGATIVE  MRSA PCR Screening     Status: None   Collection Time: 09/08/16 11:56 AM  Result Value Ref Range   MRSA by PCR NEGATIVE NEGATIVE    Comment:        The GeneXpert MRSA Assay (FDA approved for NASAL specimens only), is one component of a comprehensive MRSA colonization surveillance program. It is not intended to diagnose MRSA infection nor to guide or monitor treatment for MRSA infections.   Glucose, capillary      Status: Abnormal   Collection Time: 09/08/16 12:32 PM  Result Value Ref Range   Glucose-Capillary 108 (H) 65 - 99 mg/dL   Comment 1 Notify RN    Comment 2 Document in Chart  PSA     Status: None   Collection Time: 09/08/16  6:01 PM  Result Value Ref Range   PSA 0.02 0.00 - 4.00 ng/mL    Comment: (NOTE) While PSA levels of <=4.0 ng/ml are reported as reference range, some men with levels below 4.0 ng/ml can have prostate cancer and many men with PSA above 4.0 ng/ml do not have prostate cancer.  Other tests such as free PSA, age specific reference ranges, PSA velocity and PSA doubling time may be helpful especially in men less than 9 years old.   Hemoglobin and hematocrit, blood     Status: Abnormal   Collection Time: 09/08/16  6:01 PM  Result Value Ref Range   Hemoglobin 8.2 (L) 13.0 - 17.0 g/dL   HCT 26.4 (L) 39.0 - 52.0 %  Heparin level (unfractionated)     Status: None   Collection Time: 09/08/16  6:01 PM  Result Value Ref Range   Heparin Unfractionated 0.51 0.30 - 0.70 IU/mL    Comment:        IF HEPARIN RESULTS ARE BELOW EXPECTED VALUES, AND PATIENT DOSAGE HAS BEEN CONFIRMED, SUGGEST FOLLOW UP TESTING OF ANTITHROMBIN III LEVELS.   Heparin level (unfractionated)     Status: None   Collection Time: 09/09/16  5:29 AM  Result Value Ref Range   Heparin Unfractionated 0.62 0.30 - 0.70 IU/mL    Comment:        IF HEPARIN RESULTS ARE BELOW EXPECTED VALUES, AND PATIENT DOSAGE HAS BEEN CONFIRMED, SUGGEST FOLLOW UP TESTING OF ANTITHROMBIN III LEVELS.   CBC     Status: Abnormal   Collection Time: 09/09/16  5:30 AM  Result Value Ref Range   WBC 8.0 4.0 - 10.5 K/uL   RBC 3.88 (L) 4.22 - 5.81 MIL/uL   Hemoglobin 8.1 (L) 13.0 - 17.0 g/dL   HCT 26.1 (L) 39.0 - 52.0 %   MCV 67.3 (L) 78.0 - 100.0 fL   MCH 20.9 (L) 26.0 - 34.0 pg   MCHC 31.0 30.0 - 36.0 g/dL   RDW 17.1 (H) 11.5 - 15.5 %   Platelets 271 150 - 400 K/uL  Basic metabolic panel     Status: Abnormal    Collection Time: 09/09/16  5:30 AM  Result Value Ref Range   Sodium 138 135 - 145 mmol/L   Potassium 3.4 (L) 3.5 - 5.1 mmol/L   Chloride 110 101 - 111 mmol/L   CO2 22 22 - 32 mmol/L   Glucose, Bld 114 (H) 65 - 99 mg/dL   BUN 10 6 - 20 mg/dL   Creatinine, Ser 0.81 0.61 - 1.24 mg/dL   Calcium 9.2 8.9 - 10.3 mg/dL   GFR calc non Af Amer >60 >60 mL/min   GFR calc Af Amer >60 >60 mL/min    Comment: (NOTE) The eGFR has been calculated using the CKD EPI equation. This calculation has not been validated in all clinical situations. eGFR's persistently <60 mL/min signify possible Chronic Kidney Disease.    Anion gap 6 5 - 15  Protime-INR     Status: Abnormal   Collection Time: 09/09/16  5:30 AM  Result Value Ref Range   Prothrombin Time 16.5 (H) 11.4 - 15.2 seconds   INR 1.32   APTT     Status: Abnormal   Collection Time: 09/09/16  5:30 AM  Result Value Ref Range   aPTT 114 (H) 24 - 36 seconds    Comment:        IF  BASELINE aPTT IS ELEVATED, SUGGEST PATIENT RISK ASSESSMENT BE USED TO DETERMINE APPROPRIATE ANTICOAGULANT THERAPY.   Magnesium     Status: None   Collection Time: 09/09/16  8:45 AM  Result Value Ref Range   Magnesium 1.8 1.7 - 2.4 mg/dL   Ct Angio Chest Pe W And/or Wo Contrast  Result Date: 09/08/2016 CLINICAL DATA:  Pt with increased SOB, recent hx PE on blood thinners but stopped due to some rectal bleeding, no chest pain HTN, prostate cancer- seeds, PE Bun/creat within normal limits EXAM: CT ANGIOGRAPHY CHEST WITH CONTRAST TECHNIQUE: Multidetector CT imaging of the chest was performed using the standard protocol during bolus administration of intravenous contrast. Multiplanar CT image reconstructions and MIPs were obtained to evaluate the vascular anatomy. CONTRAST:  100 mL isovue 370, pt nauseous after scan COMPARISON:  06/19/2016 FINDINGS: Cardiovascular: Left arm IV contrast administration. Innominate vein and SVC patent. Mild right atrial enlargement. Dilated right  ventricle, RV/ LV ratio 1.5. There is good contrast opacification of pulmonary artery. Bilateral central and segmental pulmonary emboli involving upper and lower lobe branches. Some are clearly new since previous examination (which also demonstrated bilateral PE). Patent bilateral pulmonary veins drain into the left atrium. Adequate contrast opacification of the thoracic aorta with no evidence of dissection, aneurysm, or stenosis. There is classic 3-vessel brachiocephalic arch anatomy without proximal stenosis. Scattered plaque in the distal arch. Mediastinum/Nodes: Enlarged 13 mm left hilar node, along with subcentimeter prevascular, pretracheal, and precarinal lymph nodes as before. No mediastinal hematoma. No pericardial effusion. Lungs/Pleura: Bilateral lower lobe Infrahilar airspace consolidation right worse than left, new since prior study. Patchy infiltrate in the medial right upper lobe suprahilar region, new since previous. No pleural effusion. No pneumothorax. Upper Abdomen: No acute findings.  Stable probable hepatic cysts. Musculoskeletal: Mild spondylitic changes in the mid and lower thoracic spine. No fracture or or worrisome bone lesion. Review of the MIP images confirms the above findings. IMPRESSION: 1. Positive for recurrent bilateral central and segmental acute PE with CT evidence of right heart strain (RV/LV Ratio = 1.5) consistent with at least submassive (intermediate risk) PE. The presence of right heart strain has been associated with an increased risk of morbidity and mortality. Please activate Code PE by paging [phone]. Critical Value/emergent results were called by telephone at the time of interpretation on 09/08/2016 at 9:04 am to Dr. Sherwood Gambler , who verbally acknowledged these results. 2. Patchy perihilar airspace opacities in both lower lobes and medial right upper lobe, more characteristic of infectious/inflammatory process or atypical edema than pulmonary infarct. 3.  Borderline enlarged left hilar lymph node, possibly reactive but nonspecific. Electronically Signed   By: Lucrezia Europe M.D.   On: 09/08/2016 09:04   Dg Chest Port 1 View  Result Date: 09/08/2016 CLINICAL DATA:  Shortness of Breath EXAM: PORTABLE CHEST 1 VIEW COMPARISON:  06/19/2016 FINDINGS: The heart size and mediastinal contours are within normal limits. Both lungs are clear. The visualized skeletal structures are unremarkable. IMPRESSION: No active disease. Electronically Signed   By: Inez Catalina M.D.   On: 09/08/2016 08:34    Assessment: 1. Bilateral pulmonary emboli 2. Rectal bleeding 10 days ago on Coumadin 3. Heme-negative stool on presentation yesterday. 4. Apparently up-to-date on surveillance colonoscopy with last colonoscopy estimated 2015 or 16. Plan:  1. Treat pulmonary emboli as indicated including any anticoagulation. 2. We'll monitor stools and hemoglobin for signs of bleeding. 3. Will follow with you. Maple Odaniel C 09/09/2016, 1:45 PM  Pager [phone] If no answer  or after 5 PM call [phone]

## 2016-09-09 NOTE — Progress Notes (Signed)
ANTICOAGULATION CONSULT NOTE- Follow-Up Consult  Pharmacy Consult for Heparin Indication: pulmonary embolus  No Known Allergies  Patient Measurements: Height: 5\' 11"  (180.3 cm) Weight: 211 lb 6.4 oz (95.9 kg) IBW/kg (Calculated) : 75.3 Heparin Dosing Weight: 94  Vital Signs: Temp: 98.3 F (36.8 C) (03/11 0733) Temp Source: Oral (03/11 0733) BP: 98/61 (03/11 0733) Pulse Rate: 102 (03/11 0733)  Labs:  Recent Labs  09/08/16 0755 09/08/16 1801 09/09/16 0529 09/09/16 0530  HGB 8.8* 8.2*  --  8.1*  HCT 27.5* 26.4*  --  26.1*  PLT 315  --   --  271  APTT  --   --   --  114*  LABPROT 16.1*  --   --  16.5*  INR 1.28  --   --  1.32  HEPARINUNFRC  --  0.51 0.62  --   CREATININE 1.08  --   --  0.81  TROPONINI 0.16*  --   --   --     Estimated Creatinine Clearance: 90.2 mL/min (by C-G formula based on SCr of 0.81 mg/dL).   Medical History: Past Medical History:  Diagnosis Date  . Cancer ([place])   . Hypertension   . Pulmonary embolism [facility])     Assessment: 76yo male presenting to [facility] with worsening SOB and new PEs on CT with right heart strain.  Pt was previously on Coumadin for PE, but this was stopped on 3/2 due to rectal bleeding. GI evaluated patient 3/10 - stool heme negative. Heparin level is at goal this morning at 0.62 on 1400 units/hr. CBC is low but stable. No current signs of a bleed noted.   Goal of Therapy:  Heparin level 0.3-0.7 units/ml Monitor platelets by anticoagulation protocol: Yes   Plan:  Continue heparin at 1400 units/hr Daily heparin level/CBC Monitor for signs/symptoms of bleeding  Monitor GI evaluation and long-term plan for anticoag  Uvaldo Bristle, PharmD PGY1 Pharmacy Resident Pager: [phone]  09/09/2016 8:54 AM

## 2016-09-09 NOTE — Progress Notes (Signed)
VASCULAR LAB PRELIMINARY  PRELIMINARY  PRELIMINARY  PRELIMINARY  Bilateral lower extremity venous duplex completed.    Preliminary report:  Right - No evidence of DVT or superficial thrombosis there is evidence of a Baker's cyst in the popliteal fossa. Left - No evidence of a DVT, superficial thrombosis, or Baker's cyst.  Viral Schramm, RVS 09/09/2016, 10:57 AM

## 2016-09-10 ENCOUNTER — Encounter (HOSPITAL_COMMUNITY): Payer: Self-pay | Admitting: Cardiology

## 2016-09-10 DIAGNOSIS — R000 Tachycardia, unspecified: Secondary | ICD-10-CM

## 2016-09-10 DIAGNOSIS — I5022 Chronic systolic (congestive) heart failure: Secondary | ICD-10-CM

## 2016-09-10 DIAGNOSIS — J9601 Acute respiratory failure with hypoxia: Secondary | ICD-10-CM | POA: Diagnosis present

## 2016-09-10 DIAGNOSIS — I420 Dilated cardiomyopathy: Secondary | ICD-10-CM | POA: Diagnosis present

## 2016-09-10 DIAGNOSIS — I10 Essential (primary) hypertension: Secondary | ICD-10-CM

## 2016-09-10 DIAGNOSIS — D649 Anemia, unspecified: Secondary | ICD-10-CM

## 2016-09-10 LAB — BASIC METABOLIC PANEL
Anion gap: 6 (ref 5–15)
BUN: 9 mg/dL (ref 6–20)
CO2: 23 mmol/L (ref 22–32)
CREATININE: 0.89 mg/dL (ref 0.61–1.24)
Calcium: 9 mg/dL (ref 8.9–10.3)
Chloride: 107 mmol/L (ref 101–111)
GFR calc non Af Amer: >60 mL/min (ref >60)
Glucose, Bld: 108 mg/dL — ABNORMAL HIGH (ref 65–99)
Potassium: 3.7 mmol/L (ref 3.5–5.1)
SODIUM: 136 mmol/L (ref 135–145)

## 2016-09-10 LAB — CBC
HCT: 24.9 % — ABNORMAL LOW (ref 39.0–52.0)
Hemoglobin: 7.8 g/dL — ABNORMAL LOW (ref 13.0–17.0)
MCH: 21.1 pg — AB (ref 26.0–34.0)
MCHC: 31.3 g/dL (ref 30.0–36.0)
MCV: 67.3 fL — ABNORMAL LOW (ref 78.0–100.0)
PLATELETS: 245 10*3/uL (ref 150–400)
RBC: 3.7 MIL/uL — AB (ref 4.22–5.81)
RDW: 16.9 % — ABNORMAL HIGH (ref 11.5–15.5)
WBC: 6.6 10*3/uL (ref 4.0–10.5)

## 2016-09-10 LAB — HEMOGLOBIN AND HEMATOCRIT, BLOOD
HCT: 27.2 % — ABNORMAL LOW (ref 39.0–52.0)
HEMOGLOBIN: 8.4 g/dL — AB (ref 13.0–17.0)

## 2016-09-10 LAB — HEPARIN LEVEL (UNFRACTIONATED): HEPARIN UNFRACTIONATED: 0.53 [IU]/mL (ref 0.30–0.70)

## 2016-09-10 MED ORDER — POTASSIUM CHLORIDE CRYS ER 20 MEQ PO TBCR
20.0000 meq | EXTENDED_RELEASE_TABLET | Freq: Once | ORAL | Status: AC
  Administered 2016-09-10: 20 meq via ORAL
  Filled 2016-09-10: qty 1

## 2016-09-10 MED ORDER — LISINOPRIL 5 MG PO TABS
5.0000 mg | ORAL_TABLET | Freq: Every day | ORAL | Status: DC
  Administered 2016-09-10 – 2016-09-12 (×3): 5 mg via ORAL
  Filled 2016-09-10 (×3): qty 1

## 2016-09-10 MED ORDER — MAGNESIUM OXIDE 400 (241.3 MG) MG PO TABS
400.0000 mg | ORAL_TABLET | Freq: Two times a day (BID) | ORAL | Status: AC
  Administered 2016-09-10 – 2016-09-11 (×4): 400 mg via ORAL
  Filled 2016-09-10 (×4): qty 1

## 2016-09-10 NOTE — Consult Note (Signed)
Cardiology Consult    Patient ID: Francisco Rogers MRN: [id number], DOB/AGE: [birth year]   Admit date: 09/08/2016 Date of Consult: 09/10/2016  Primary Physician: No primary care provider on file. Reason for Consult: Decreased EF Primary Cardiologist: New- Dr. Gwenlyn Found Requesting Provider: Dr. Lonny Prude  History of Present Illness    Francisco Rogers is a 76 y.o. with past medical history significant for bilateral PE diagnosed in 06/2016, HTN, Osteoarthritis and Prostate cancer S/P seed radiation approx. 3 years ago who presented to the [facility] on 09/08/2016 for evaluation of progressive shortness of breath. Cardiology has been asked to consult for evaluation of decreased LV EF.  The patient was diagnosed with bilateral PE at [facility] in 06/2016 and started on coumadin. LE dopplers were negative for DVT and no causative factor was identified. Previous echo (at [facility]) in 06/2016 with EF 45%. The coumadin was stopped on 3/2 secondary to a few episodes of bloody stools. He was to have a colonoscopy scheduled with Dr. Hassell Done for 09/10/2016. He has had mild shortness of breath with exertion since 06/2016. On Friday he developed increased DOE and on Saturday morning was worse so he presented to [place]. Currently his breathing is much improved.  In 06/2016 was his first hospital admission in his life. He has no history of MI, cardiac issues or evaluations, no diabetes, no cholesterol issues, no previous blood clots. He is a non smoker and only occasionally drinks alcohol. His family history is significant for mother dying at age 6 of cardiac complications, brother died of CAD at age 54, brother alive at age 73 with CAD.   He denies chest pain, palpitations, swelling, orthopnea, dizziness, near syncope or syncope. He was tachycardic on admission. He had DOE, but is better now.  Labs:  Hgb 8.2>>7.9. K+ 3.7, SCr 0.89, FOBT negative, PSA 0.02  CTA chest: positive for recurrent bilateral  central and segmental acute PE with CT evidence of right heart strain (RV/LV Ratio = 1.5) consistent with at least submassive (intermediate risk) PE. Patchy perihilar airspace opacities in both lower lobes and medial right upper lobe, more characteristic of infectious/inflammatory process or atypical edema than pulmonary infarct.  Echo shows LV EF 30-35%, diffuse hypokinesis, mildly dilated RV with moderately reduced RV function. PASP 39 mmHg.  Lower extremity dopplers have been ordered.   Past Medical History   Past Medical History:  Diagnosis Date  . Cancer ([place])   . Hypertension   . Pulmonary embolism [facility])     Past Surgical History:  Procedure Laterality Date  . HERNIA REPAIR       Allergies  No Known Allergies  Inpatient Medications    . carvedilol  12.5 mg Oral BID WC  . lisinopril  5 mg Oral Daily  . magnesium oxide  400 mg Oral BID    Family History    Family History  Problem Relation Age of Onset  . Heart attack Mother   . Pneumonia Father   . Diabetes Sister   . Breast cancer Sister   . CAD Brother     Social History    Social History   Social History  . Marital status: Married    Spouse name: N/A  . Number of children: N/A  . Years of education: N/A   Occupational History  . Not on file.   Social History Main Topics  . Smoking status: Never Smoker  . Smokeless tobacco: Never Used  . Alcohol use  Yes     Comment: socially  . Drug use: No  . Sexual activity: Not on file   Other Topics Concern  . Not on file   Social History Narrative  . No narrative on file     Review of Systems    General:  No chills, fever, night sweats or weight changes.  Cardiovascular:  Positive for DOE, No chest pain,edema, orthopnea, palpitations, paroxysmal nocturnal dyspnea. Dermatological: No rash, lesions/masses Respiratory: Positive for DOE, No cough Urologic: No hematuria, dysuria Abdominal:   No nausea, vomiting, diarrhea, Positive for blood in stool a  week prior Neurologic:  No visual changes, wkns, changes in mental status. All other systems reviewed and are otherwise negative except as noted above.  Physical Exam    Blood pressure 135/88, pulse (!) 112, temperature 98.5 F (36.9 C), temperature source Oral, resp. rate 18, height 5\' 11"  (1.803 m), weight 209 lb 8 oz (95 kg), SpO2 100 %.  General: Pleasant, NAD Psych: Normal affect. Neuro: Alert and oriented X 3. Moves all extremities spontaneously. HEENT: Normal  Neck: Supple without bruits or JVD. Lungs:  Resp regular and unlabored, CTA. Heart: RRR no s3, s4, or murmurs. Abdomen: Soft, non-tender, non-distended, BS + x 4.  Extremities: No clubbing, cyanosis or edema. DP/PT/Radials 2+ and equal bilaterally.  Labs    Troponin (Point of Care Test) No results for input(s): TROPIPOC in the last 72 hours.  Recent Labs  09/08/16 0755  TROPONINI 0.16*   Lab Results  Component Value Date   WBC 6.6 09/10/2016   HGB 7.8 (L) 09/10/2016   HCT 24.9 (L) 09/10/2016   MCV 67.3 (L) 09/10/2016   PLT 245 09/10/2016     Recent Labs Lab 09/10/16 0359  NA 136  K 3.7  CL 107  CO2 23  BUN 9  CREATININE 0.89  CALCIUM 9.0  GLUCOSE 108*   No results found for: CHOL, HDL, LDLCALC, TRIG No results found for: [facility]   Radiology Studies    Ct Angio Chest Pe W And/or Wo Contrast  Result Date: 09/08/2016 CLINICAL DATA:  Pt with increased SOB, recent hx PE on blood thinners but stopped due to some rectal bleeding, no chest pain HTN, prostate cancer- seeds, PE Bun/creat within normal limits EXAM: CT ANGIOGRAPHY CHEST WITH CONTRAST TECHNIQUE: Multidetector CT imaging of the chest was performed using the standard protocol during bolus administration of intravenous contrast. Multiplanar CT image reconstructions and MIPs were obtained to evaluate the vascular anatomy. CONTRAST:  100 mL isovue 370, pt nauseous after scan COMPARISON:  06/19/2016 FINDINGS: Cardiovascular: Left arm IV contrast  administration. Innominate vein and SVC patent. Mild right atrial enlargement. Dilated right ventricle, RV/ LV ratio 1.5. There is good contrast opacification of pulmonary artery. Bilateral central and segmental pulmonary emboli involving upper and lower lobe branches. Some are clearly new since previous examination (which also demonstrated bilateral PE). Patent bilateral pulmonary veins drain into the left atrium. Adequate contrast opacification of the thoracic aorta with no evidence of dissection, aneurysm, or stenosis. There is classic 3-vessel brachiocephalic arch anatomy without proximal stenosis. Scattered plaque in the distal arch. Mediastinum/Nodes: Enlarged 13 mm left hilar node, along with subcentimeter prevascular, pretracheal, and precarinal lymph nodes as before. No mediastinal hematoma. No pericardial effusion. Lungs/Pleura: Bilateral lower lobe Infrahilar airspace consolidation right worse than left, new since prior study. Patchy infiltrate in the medial right upper lobe suprahilar region, new since previous. No pleural effusion. No pneumothorax. Upper Abdomen: No acute findings.  Stable probable  hepatic cysts. Musculoskeletal: Mild spondylitic changes in the mid and lower thoracic spine. No fracture or or worrisome bone lesion. Review of the MIP images confirms the above findings. IMPRESSION: 1. Positive for recurrent bilateral central and segmental acute PE with CT evidence of right heart strain (RV/LV Ratio = 1.5) consistent with at least submassive (intermediate risk) PE. The presence of right heart strain has been associated with an increased risk of morbidity and mortality. Please activate Code PE by paging [phone]. Critical Value/emergent results were called by telephone at the time of interpretation on 09/08/2016 at 9:04 am to Dr. Sherwood Gambler , who verbally acknowledged these results. 2. Patchy perihilar airspace opacities in both lower lobes and medial right upper lobe, more  characteristic of infectious/inflammatory process or atypical edema than pulmonary infarct. 3. Borderline enlarged left hilar lymph node, possibly reactive but nonspecific. Electronically Signed   By: Lucrezia Europe M.D.   On: 09/08/2016 09:04   Dg Chest Port 1 View  Result Date: 09/08/2016 CLINICAL DATA:  Shortness of Breath EXAM: PORTABLE CHEST 1 VIEW COMPARISON:  06/19/2016 FINDINGS: The heart size and mediastinal contours are within normal limits. Both lungs are clear. The visualized skeletal structures are unremarkable. IMPRESSION: No active disease. Electronically Signed   By: Inez Catalina M.D.   On: 09/08/2016 08:34    EKG & Cardiac Imaging   EKG: Sinus tachycardia, 127 bpm, Diffuse non-specific T wave abnormalites and probable left atrial enlargement.   Echocardiogram:   Study Conclusions  - Left ventricle: The cavity size was normal. Wall thickness was   increased in a pattern of mild LVH. Systolic function was   moderately to severely reduced. The estimated ejection fraction   was in the range of 30% to 35%. Diffuse hypokinesis. The study is   not technically sufficient to allow evaluation of LV diastolic   function. - Ventricular septum: The contour showed diastolic flattening. - Aortic valve: Mildly calcified annulus. Trileaflet. - Mitral valve: Calcified annulus. - Right ventricle: The cavity size was mildly dilated. Systolic   function was moderately reduced. - Atrial septum: No defect or patent foramen ovale was identified. - Tricuspid valve: There was mild regurgitation. - Pulmonary arteries: PA peak pressure: 39 mm Hg (S). - Pericardium, extracardiac: There was no pericardial effusion.  Impressions:  - Mild LVH with LVEF approximately 30-35% and diffuse hypokinesis.   Indeterminate diastolic function. Mildly calcified mitral   annulus. Mildly dilated right ventricle with moderately reduced   contraction. Mild tricuspid regurgitation with PASP estimated 39    mmHg.   Assessment & Plan    1.  Acute LV systolic dysfunction  -In setting of acute submassive bilateral PE. No previous MI or cardiac event. Previous echo (at [facility]) in 06/2016 with EF 45%. -BNP 319.9. Pt appears euvolemic. -LV systolic dysfunction with EF 30-35% of unknown etiology -Currently his acute problem is PE, but he should have further cardiac evaluation in the future with probable out patient nuclear study to assess for ischemic causes of LV dysfunction.  -Was treated with carvedilol outpatient, continue. ACE-I has been added.   2. Pulmonary embolism with right heart strain -Elevated PA pressure of 39 mm Hg on echo, EF 30-35% -CTA cheat showed evidence of right heart strain (RV/LV Ratio = 1.5)   -Treated with heparin drip by IM with plan to restart coumadin.  Will likely need lifelong therapy as this is recurrent and appears to be unprovoked, although he has a history of prostate cancer and could have pelvic  thrombus origin. PSA 0.02 -Could consider IVC filter if does not tolerate coumadin due to bleeding.  3. Hypertension -Mild hypertensive CV disease by echo -BP controlled.   4. Anemia -Secondary to acute blood loss. IM following and will transfuse if Hgb below 8. -GI was consulted and will treat while continuing anticoagulation.  SignedDaune Perch, NP-C 09/10/2016, 12:12 PM Pager: [phone]  Agree with note by Daune Perch nurse practitioner  Mr. Seltzer is a 76 year old gentleman without prior cardiac history who had large pulmonary embolus back in December of last year treated at [facility]. He was on Coumadin anticoagulation. His other problems include hypertension. His was discontinued because of GI bleed earlier this month and he was admitted on 09/08/16 with recurrent pulmonary emboli and right heart strain. He was placed on IV heparin. Hemoglobin is low at 7.8. GI is following. Lower extremity venous Dopplers apparently have been  negative. The patient really has no risk factors for PE. Given his recurrent pulmonary emboli and potential GI bleed he may be a candidate for an IVC filter. Pulmonary critical care should be consulted as well. He does have an ejection fraction of 30%. It was 45% at [facility]. The etiology of his LV function is still unclear but I suspect it is nonischemic. His exam is essentially benign. He has no evidence of volume overload. He is on appropriate medications for LV dysfunction including carvedilol and an ACE inhibitor. Ischemic workup can be pursued as an outpatient after he is fully anticoagulated. We will follow with you.  Lorretta Harp, M.D., [place], [facility], [place] [address] [place], [place]  [address]  [phone] 09/10/2016 12:40 PM

## 2016-09-10 NOTE — Progress Notes (Signed)
PROGRESS NOTE    Francisco Rogers  FXJ:[id number] DOB: April 18, 1941 DOA: 09/08/2016 PCP: No primary care provider on file.   Brief Narrative: Francisco Rogers is a 76 y.o. male with medical history significant for bilateral PE diagnosed in December of 2017, HTN, Osteoarthritis and prostate CA cancer   Assessment & Plan:   Principal Problem:   Pulmonary embolism, bilateral (HCC) Active Problems:   Chronic systolic heart failure ([place])   Acute respiratory failure with hypoxia ([place])   Hypoxia   Tachycardia   Pulmonary embolism, bilateral Recurrent. Taken off coumadin for one week for GI bleed. Right heart strain read on CT, however, none seen on echocardiogram -continue heparin -will consider transitioning to coumadin today  Tachycardia Secondary to acute PE. Improved.  Acute respiratory failure with hypoxia Dyspnea Secondary to PE -continue O2  Chronic systolic heart failure EF of 30-35% with diffuse hypokinesis on echo performed 09/09/2016. No history of drug use, significant alcohol use, heart attack. -daily weights -continue coreg -start low dose lisinopril -consult cardiology  GI bleeding Fecal occult test negative. GI consulted. No current evidence of bleeding, although hemoglobin pretty stable. -GI recommendations: conservative management for now -repeat CBC  Anemia secondary to acute blood loss Decreased today below 8. No evidence of active bleeding -repeat H&H this afternoon; if below 8, will transfuse 1 unit PRBC -CBC in AM  History of prostate cancer S/p seed radiation. Follows in [employer]. PSA 0.02  Hypokalemia Mild. Resolved. -Kdur 47meq x1 to keep potassium >4 -mag oxide 400mg  BID x2 days to keep magnesium >2   DVT prophylaxis: Heparin drip Code Status: Full code Family Communication: None at bedside Disposition Plan: Discharge home pending transition to oral anticoagulation   Consultants:   Gastroenterology, Dr. Amedeo Plenty  Cardiology  [facility])  Procedures:   Echocardiogram (09/09/2016)  Antimicrobials:   None    Subjective: Patient reports dyspnea. No chest pain or abdominal pain. No palpitations. No swelling.  Objective: Vitals:   09/09/16 1539 09/09/16 2134 09/10/16 0513 09/10/16 0820  BP: 123/78 112/65 132/81 (!) 149/99  Pulse: 68 (!) 109 66 100  Resp: (!) 32 (!) 27 (!) 25 (!) 29  Temp:  98.5 F (36.9 C) 98.3 F (36.8 C) 98.7 F (37.1 C)  TempSrc:  Oral Oral Oral  SpO2: 94% 97% 96% 99%  Weight:   95 kg (209 lb 8 oz)   Height:        Intake/Output Summary (Last 24 hours) at 09/10/16 0824 Last data filed at 09/10/16 6415  Gross per 24 hour  Intake              352 ml  Output              175 ml  Net              177 ml   Filed Weights   09/08/16 0751 09/09/16 0438 09/10/16 0513  Weight: 98.4 kg (217 lb) 95.9 kg (211 lb 6.4 oz) 95 kg (209 lb 8 oz)    Examination:  General exam: Appears calm and comfortable Respiratory system: Clear to auscultation. Respiratory effort normal. Cardiovascular system: S1 & S2 heard, tachycardia, regular rhythm. No murmurs. Gastrointestinal system: Abdomen is nondistended, soft and nontender. Normal bowel sounds heard. Central nervous system: Alert and oriented. No focal neurological deficits. Extremities: No edema. No calf tenderness Skin: No cyanosis. No rashes Psychiatry: Judgement and insight appear normal. Mood & affect appropriate.     Data Reviewed: I have personally reviewed  following labs and imaging studies  CBC:  Recent Labs Lab 09/08/16 0755 09/08/16 1801 09/09/16 0530 09/10/16 0359  WBC 7.6  --  8.0 6.6  NEUTROABS 4.8  --   --   --   HGB 8.8* 8.2* 8.1* 7.8*  HCT 27.5* 26.4* 26.1* 24.9*  MCV 67.1*  --  67.3* 67.3*  PLT 315  --  271 294   Basic Metabolic Panel:  Recent Labs Lab 09/08/16 0755 09/09/16 0530 09/09/16 0845 09/10/16 0359  NA 136 138  --  136  K 3.7 3.4*  --  3.7  CL 107 110  --  107  CO2 21* 22  --  23  GLUCOSE 152*  114*  --  108*  BUN 16 10  --  9  CREATININE 1.08 0.81  --  0.89  CALCIUM 9.8 9.2  --  9.0  MG  --   --  1.8  --    GFR: Estimated Creatinine Clearance: 81.8 mL/min (by C-G formula based on SCr of 0.89 mg/dL). Liver Function Tests: No results for input(s): AST, ALT, ALKPHOS, BILITOT, PROT, ALBUMIN in the last 168 hours. No results for input(s): LIPASE, AMYLASE in the last 168 hours. No results for input(s): AMMONIA in the last 168 hours. Coagulation Profile:  Recent Labs Lab 09/08/16 0755 09/09/16 0530  INR 1.28 1.32   Cardiac Enzymes:  Recent Labs Lab 09/08/16 0755  TROPONINI 0.16*   BNP (last 3 results) No results for input(s): PROBNP in the last 8760 hours. HbA1C:  Recent Labs  09/08/16 1801  HGBA1C 5.6   CBG:  Recent Labs Lab 09/08/16 1232  GLUCAP 108*   Lipid Profile: No results for input(s): CHOL, HDL, LDLCALC, TRIG, CHOLHDL, LDLDIRECT in the last 72 hours. Thyroid Function Tests: No results for input(s): TSH, T4TOTAL, FREET4, T3FREE, THYROIDAB in the last 72 hours. Anemia Panel: No results for input(s): VITAMINB12, FOLATE, FERRITIN, TIBC, IRON, RETICCTPCT in the last 72 hours. Sepsis Labs: No results for input(s): PROCALCITON, LATICACIDVEN in the last 168 hours.  Recent Results (from the past 240 hour(s))  MRSA PCR Screening     Status: None   Collection Time: 09/08/16 11:56 AM  Result Value Ref Range Status   MRSA by PCR NEGATIVE NEGATIVE Final    Comment:        The GeneXpert MRSA Assay (FDA approved for NASAL specimens only), is one component of a comprehensive MRSA colonization surveillance program. It is not intended to diagnose MRSA infection nor to guide or monitor treatment for MRSA infections.          Radiology Studies: Ct Angio Chest Pe W And/or Wo Contrast  Result Date: 09/08/2016 CLINICAL DATA:  Pt with increased SOB, recent hx PE on blood thinners but stopped due to some rectal bleeding, no chest pain HTN, prostate  cancer- seeds, PE Bun/creat within normal limits EXAM: CT ANGIOGRAPHY CHEST WITH CONTRAST TECHNIQUE: Multidetector CT imaging of the chest was performed using the standard protocol during bolus administration of intravenous contrast. Multiplanar CT image reconstructions and MIPs were obtained to evaluate the vascular anatomy. CONTRAST:  100 mL isovue 370, pt nauseous after scan COMPARISON:  06/19/2016 FINDINGS: Cardiovascular: Left arm IV contrast administration. Innominate vein and SVC patent. Mild right atrial enlargement. Dilated right ventricle, RV/ LV ratio 1.5. There is good contrast opacification of pulmonary artery. Bilateral central and segmental pulmonary emboli involving upper and lower lobe branches. Some are clearly new since previous examination (which also demonstrated bilateral PE). Patent bilateral pulmonary  veins drain into the left atrium. Adequate contrast opacification of the thoracic aorta with no evidence of dissection, aneurysm, or stenosis. There is classic 3-vessel brachiocephalic arch anatomy without proximal stenosis. Scattered plaque in the distal arch. Mediastinum/Nodes: Enlarged 13 mm left hilar node, along with subcentimeter prevascular, pretracheal, and precarinal lymph nodes as before. No mediastinal hematoma. No pericardial effusion. Lungs/Pleura: Bilateral lower lobe Infrahilar airspace consolidation right worse than left, new since prior study. Patchy infiltrate in the medial right upper lobe suprahilar region, new since previous. No pleural effusion. No pneumothorax. Upper Abdomen: No acute findings.  Stable probable hepatic cysts. Musculoskeletal: Mild spondylitic changes in the mid and lower thoracic spine. No fracture or or worrisome bone lesion. Review of the MIP images confirms the above findings. IMPRESSION: 1. Positive for recurrent bilateral central and segmental acute PE with CT evidence of right heart strain (RV/LV Ratio = 1.5) consistent with at least submassive  (intermediate risk) PE. The presence of right heart strain has been associated with an increased risk of morbidity and mortality. Please activate Code PE by paging [phone]. Critical Value/emergent results were called by telephone at the time of interpretation on 09/08/2016 at 9:04 am to Dr. Sherwood Gambler , who verbally acknowledged these results. 2. Patchy perihilar airspace opacities in both lower lobes and medial right upper lobe, more characteristic of infectious/inflammatory process or atypical edema than pulmonary infarct. 3. Borderline enlarged left hilar lymph node, possibly reactive but nonspecific. Electronically Signed   By: Lucrezia Europe M.D.   On: 09/08/2016 09:04        Scheduled Meds: . carvedilol  12.5 mg Oral BID WC   Continuous Infusions: . heparin 1,400 Units/hr (09/09/16 2315)     LOS: 2 days     Cordelia Poche, MD [facility] 09/10/2016, 8:24 AM Pager: [phone]  If 7PM-7AM, please contact night-coverage [email or web address] Password [facility] 09/10/2016, 8:24 AM

## 2016-09-10 NOTE — Progress Notes (Signed)
ANTICOAGULATION CONSULT NOTE- Follow-Up Consult  Pharmacy Consult for Heparin Indication: pulmonary embolus  No Known Allergies  Patient Measurements: Height: 5\' 11"  (180.3 cm) Weight: 209 lb 8 oz (95 kg) IBW/kg (Calculated) : 75.3 Heparin Dosing Weight: 94  Vital Signs: Temp: 98.7 F (37.1 C) (03/12 0820) Temp Source: Oral (03/12 0820) BP: 149/99 (03/12 0820) Pulse Rate: 100 (03/12 0820)  Labs:  Recent Labs  09/08/16 0755 09/08/16 1801 09/09/16 0529 09/09/16 0530 09/10/16 0359  HGB 8.8* 8.2*  --  8.1* 7.8*  HCT 27.5* 26.4*  --  26.1* 24.9*  PLT 315  --   --  271 245  APTT  --   --   --  114*  --   LABPROT 16.1*  --   --  16.5*  --   INR 1.28  --   --  1.32  --   HEPARINUNFRC  --  0.51 0.62  --  0.53  CREATININE 1.08  --   --  0.81 0.89  TROPONINI 0.16*  --   --   --   --     Estimated Creatinine Clearance: 81.8 mL/min (by C-G formula based on SCr of 0.89 mg/dL).   Medical History: Past Medical History:  Diagnosis Date  . Cancer ([place])   . Hypertension   . Pulmonary embolism [facility])     Assessment: 76yo male presenting to [facility] with worsening SOB and new PEs on CT with right heart strain.  Pt was previously on Coumadin for PE, but this was stopped on 3/2 due to rectal bleeding. GI following along and recommends to continue with anticoagulation.   Heparin level is at goal this morning at 0.5 on 1400 units/hr. Hgb is low and transfusion is planned. No current signs of a bleed noted.   Goal of Therapy:  Heparin level 0.3-0.7 units/ml Monitor platelets by anticoagulation protocol: Yes   Plan:  Continue heparin at 1400 units/hr Daily heparin level/CBC Monitor for signs/symptoms of bleeding  Follow up restart of warfarin once stable  Erin Hearing PharmD., BCPS Clinical Pharmacist Pager [phone] 09/10/2016 10:14 AM

## 2016-09-10 NOTE — Progress Notes (Signed)
[facility] Progress Note  Francisco Rogers 76 y.o. [birth year]  CC:  Gi Bleed   Subjective:  Patient had a bowel movement today which was normal in color according to patient. Denied abdominal pain, nausea or vomiting.  ROS : Negative for abdominal pain, nausea, vomiting or active GI bleed   Objective: Vital signs in last 24 hours: Vitals:   09/10/16 0820 09/10/16 1154  BP: (!) 149/99 135/88  Pulse: 100 (!) 112  Resp: (!) 29 18  Temp: 98.7 F (37.1 C) 98.5 F (36.9 C)    Physical Exam:  General:  Alert, cooperative, no distress, appears stated age  Head:  Normocephalic, without obvious abnormality, atraumatic  Eyes:  , EOM's intact,   Lungs:   Find basilar crackles   Heart:  Regular rate and rhythm, S1, S2 normal  Abdomen:   Soft, non-tender, bowel sounds active all four quadrants,  no masses,   Extremities: Extremities normal, atraumatic, no  edema  Pulses: 2+ and symmetric    Lab Results:  Recent Labs  09/09/16 0530 09/09/16 0845 09/10/16 0359  NA 138  --  136  K 3.4*  --  3.7  CL 110  --  107  CO2 22  --  23  GLUCOSE 114*  --  108*  BUN 10  --  9  CREATININE 0.81  --  0.89  CALCIUM 9.2  --  9.0  MG  --  1.8  --    No results for input(s): AST, ALT, ALKPHOS, BILITOT, PROT, ALBUMIN in the last 72 hours.  Recent Labs  09/08/16 0755  09/09/16 0530 09/10/16 0359  WBC 7.6  --  8.0 6.6  NEUTROABS 4.8  --   --   --   HGB 8.8*  < > 8.1* 7.8*  HCT 27.5*  < > 26.1* 24.9*  MCV 67.1*  --  67.3* 67.3*  PLT 315  --  271 245  < > = values in this interval not displayed.  Recent Labs  09/08/16 0755 09/09/16 0530  LABPROT 16.1* 16.5*  INR 1.28 1.32      Assessment/Plan: - Rectal bleeding 10/11 days ago. Resolved.. No evidence of active bleeding - Bilateral pulmonary embolism.  Recommendations -------------------------- - Okay to anticoagulate from GI standpoint. - no active overt bleeding. Occult blood negative. - Follow-up with primary GI  after discharge. - Year will sign up. Call us back if needed  Otis Brace MD, FACP 09/10/2016, 12:41 PM  Pager [phone]  If no answer or after 5 PM call [phone]

## 2016-09-10 NOTE — Progress Notes (Signed)
Patient ID: Francisco Rogers, male   DOB: 10-28-1940, 76 y.o.   MRN: [id number] Aware of request for evaluation of candidacy of IVC filter.  Dr. Kathlene Cote hs reviewed his imaging and story and states he should definitely be placed back on anticoagulation since given the green light by GI.  We will await lower extremity duplex.  If this is +, then he would likely be a candidate for a filter; however, if this is negative, then he would not be a candidate for filter placement.  Ronell Boldin E 3:31 PM 09/10/2016

## 2016-09-11 ENCOUNTER — Telehealth: Payer: Self-pay | Admitting: Internal Medicine

## 2016-09-11 DIAGNOSIS — J9601 Acute respiratory failure with hypoxia: Secondary | ICD-10-CM

## 2016-09-11 DIAGNOSIS — I2699 Other pulmonary embolism without acute cor pulmonale: Principal | ICD-10-CM

## 2016-09-11 LAB — CBC
HCT: 25.2 % — ABNORMAL LOW (ref 39.0–52.0)
Hemoglobin: 7.9 g/dL — ABNORMAL LOW (ref 13.0–17.0)
MCH: 21.1 pg — ABNORMAL LOW (ref 26.0–34.0)
MCHC: 31.3 g/dL (ref 30.0–36.0)
MCV: 67.2 fL — ABNORMAL LOW (ref 78.0–100.0)
PLATELETS: 255 10*3/uL (ref 150–400)
RBC: 3.75 MIL/uL — AB (ref 4.22–5.81)
RDW: 16.9 % — ABNORMAL HIGH (ref 11.5–15.5)
WBC: 6.2 10*3/uL (ref 4.0–10.5)

## 2016-09-11 LAB — HEPARIN LEVEL (UNFRACTIONATED): Heparin Unfractionated: 0.45 IU/mL (ref 0.30–0.70)

## 2016-09-11 LAB — PROTIME-INR
INR: 1.31
PROTHROMBIN TIME: 16.4 s — AB (ref 11.4–15.2)

## 2016-09-11 MED ORDER — WARFARIN SODIUM 7.5 MG PO TABS
7.5000 mg | ORAL_TABLET | Freq: Once | ORAL | Status: AC
  Administered 2016-09-11: 7.5 mg via ORAL
  Filled 2016-09-11: qty 1

## 2016-09-11 MED ORDER — COUMADIN BOOK
Freq: Once | Status: AC
  Administered 2016-09-11: 17:00:00
  Filled 2016-09-11: qty 1

## 2016-09-11 MED ORDER — WARFARIN VIDEO: Freq: Once | Status: DC

## 2016-09-11 MED ORDER — WARFARIN - PHARMACIST DOSING INPATIENT: Freq: Every day | Status: DC

## 2016-09-11 NOTE — Progress Notes (Signed)
Patient ID: Francisco Rogers, male   DOB: [birth year], 76 y.o.   MRN: [id number]  3/12:  - No evidence of deep vein or superficial thrombosis involving the   right lower extremity and left lower extremity. - Incidental findings are consistent with: Baker&'s Cyst on the   right measuring 3.58 cm x 3.77 cm. in the popliteal   fossaextending 8.89 cm into the proximal calf. - No evidence of Baker&'s cyst on the left.  Rec: Can restart anticoagulation  Per "ok" per GI note  No recommendation for IVC filter with no evidence of new DVT per Dr Kathlene Cote

## 2016-09-11 NOTE — Assessment & Plan Note (Addendum)
Rx rec:  Because of the recurrent nature of his pulmonary embolism he is now, did a lifelong anticoagulation. Despite history of recent bleed which appears to be local and rectal and given the fact GI has cleared him for anticoagulation at think it is safe to continue full dose anticoagulation but with close monitoring. He and his wife understand the risks of bleed.   I think NOAC will have a statistically slightly low bleeding risk and he understands this but does not want to commit $140 a month. However his wife feels  that the cost is not worth the risk of rebleed and she is okay to spend higher amount for NOAC. I will ask for care management consultation to see if he can get NOAC for a cheaper price  Also as time passes on of bleeding risk continues to be a problem and option would be to continue full dose antibiotic regulation for 6-12 months and then subsequently based on d-dimer monitoring switch to lower dose anticoagulation (either half strength NOAC or coumadin for INR goal > 1.5)  I think systemic or local lysis at this point is too risky and he is improving anyways so we should just use it as rescue Rx  Also I do not think there is a role for IVC filter at this point but we could address if he has bleeding  Outpatient pulmonary follow-up by calling [phone] is recommended; I have left message with my office to arrange this

## 2016-09-11 NOTE — Progress Notes (Signed)
ANTICOAGULATION CONSULT NOTE - Initial Consult  Pharmacy Consult for Coumadin Indication: pulmonary embolus  No Known Allergies  Patient Measurements: Height: 5\' 11"  (180.3 cm) Weight: 209 lb 8 oz (95 kg) IBW/kg (Calculated) : 75.3  Vital Signs: Temp: 98.6 F (37 C) (03/13 0030) Temp Source: Oral (03/13 0030) BP: 129/88 (03/13 0030) Pulse Rate: 94 (03/13 0030)  Labs:  Recent Labs  09/08/16 0755  09/09/16 0529 09/09/16 0530 09/10/16 0359 09/10/16 1322 09/11/16 0244  HGB 8.8*  < >  --  8.1* 7.8* 8.4* 7.9*  HCT 27.5*  < >  --  26.1* 24.9* 27.2* 25.2*  PLT 315  --   --  271 245  --  255  APTT  --   --   --  114*  --   --   --   LABPROT 16.1*  --   --  16.5*  --   --  16.4*  INR 1.28  --   --  1.32  --   --  1.31  HEPARINUNFRC  --   < > 0.62  --  0.53  --  0.45  CREATININE 1.08  --   --  0.81 0.89  --   --   TROPONINI 0.16*  --   --   --   --   --   --   < > = values in this interval not displayed.  Estimated Creatinine Clearance: 81.8 mL/min (by C-G formula based on SCr of 0.89 mg/dL).   Medical History: Past Medical History:  Diagnosis Date  . Cancer ([place])   . Hypertension   . Pulmonary embolism (HCC)     Medications:  Prescriptions Prior to Admission  Medication Sig Dispense Refill Last Dose  . acetaminophen (TYLENOL) 325 MG tablet Take 325-650 mg by mouth every 6 (six) hours as needed (for pain).   09/07/2016 at am  . carvedilol (COREG) 12.5 MG tablet Take 12.5 mg by mouth 2 (two) times daily with a meal.   09/07/2016 at 1200  . Multiple Vitamins-Minerals (ONE-A-DAY MENS 50+ ADVANTAGE) TABS Take 1 tablet by mouth daily.   09/07/2016 at am  . metoCLOPramide (REGLAN) 5 MG tablet Take 5 mg by mouth at bedtime.   NOT YET at NOT YET  . warfarin (COUMADIN) 5 MG tablet Take 5 mg by mouth at bedtime.   ON HOLD at ON HOLD   Scheduled:  . carvedilol  12.5 mg Oral BID WC  . lisinopril  5 mg Oral Daily  . magnesium oxide  400 mg Oral BID   Infusions:  . heparin 1,400  Units/hr (09/10/16 1635)    Assessment: 76yo male admitted 3/10 for acute PE, has been on heparin gtt since admission, now to begin Coumadin; pt was previously on Coumadin for PE Dec 2017 but was discontinued <2wk ago for rectal bleeding.  Goal of Therapy:  INR 2-3   Plan:  Will give Coumadin 7.5mg  po x1 today and monitor INR for dose adjustments; will begin Coumadin education.  Wynona Neat, PharmD, BCPS  09/11/2016,7:46 AM

## 2016-09-11 NOTE — Consult Note (Signed)
PULMONARY / CRITICAL CARE MEDICINE   Name: Francisco Rogers MRN: [id number] DOB: [birth year]    ADMISSION DATE:  09/08/2016 CONSULTATION DATE:  09/11/16  REFERRING MD:  Dr Teryl Lucy and Dr Gwenlyn Found  CHIEF COMPLAINT:  Recurrent PE - Rx recommendations  HISTORY OF PRESENT ILLNESS:   76 year old male with a history of prostate cancer that according to him is in complete remission with radiation seeds. Last treated in 2015. PSA checked at [facility] normal according to his history. He also reports normal colonoscopies by Dr. Ferdinand Lango at [facility] as recently as 2015. In December 2017 had pulmonary embolism bilateral treated at [facility] and then was on Coumadin [he could not referred NOAC because $140 co-pay every month]. Then on 08/31/2016 he had unspecified rectal bleeding at which time he stopped his Coumadin and the bleeding resolved. Then on 09/08/2016 he has been readmitted with bilateral pulmonary embolism recurrent this time to [facility]. There is evidence of RV strain on the CT chest. Echocardiogram did show chronic systolic heart failure with ejection fraction 35% and a mildly dilated right ventricle with moderately reduced contraction. His duplex lower extremity 09/09/2016 has been negative for deep vein thrombosis. GI has been consulted and according to the chart review and summarization they're okay with patient getting full anticoagulation based on his GI history but with close monitoring. Cardiology had question of IVC filter and pulmonary has therefore been consulted. Patient is currently doing well and improved with IV heparin without any bleeding episodes.  Pulmonary embolus in severity index [PES I] PESI at admit: 167 points, class 5- based on age, heart failure, history of cancer and oxygen need and tachycardia  PESI currently:  127 poitn, class 5 - based on age, heart failure history of cancer -    PAST MEDICAL HISTORY :  He  has a past medical history of Cancer ([place]); Hypertension;  and Pulmonary embolism ([place]).  PAST SURGICAL HISTORY: He  has a past surgical history that includes Hernia repair.  No Known Allergies  No current facility-administered medications on file prior to encounter.    No current outpatient prescriptions on file prior to encounter.    FAMILY HISTORY:  His indicated that his mother is deceased. He indicated that his father is deceased. He indicated that his sister is alive. He indicated that his brother is deceased.    SOCIAL HISTORY: He  reports that he has never smoked. He has never used smokeless tobacco. He reports that he drinks alcohol. He reports that he does not use drugs.  VITAL SIGNS: BP (!) 105/55 (BP Location: Right Arm)   Pulse 80   Temp 97.9 F (36.6 C) (Oral)   Resp 20   Ht 5\' 11"  (1.803 m)   Wt 95 kg (209 lb 8 oz)   SpO2 100%   BMI 29.22 kg/m    INTAKE / OUTPUT: I/O last 3 completed shifts: In: 667.8 [P.O.:480; I.V.:187.8] Out: 750 [Urine:750]   EXAM  General Appearance:    Looks well  Head:    Normocephalic, without obvious abnormality, atraumatic  Eyes:    PERRL - equal, conjunctiva/corneas - clear      Ears:    Normal external ear canals, both ears  Nose:   NG tube - no  Throat:  ETT TUBE - no , OG tube - no  Neck:   Supple,  No enlargement/tenderness/nodules     Lungs:     Clear to auscultation bilaterally, V  Chest wall:  No deformity  Heart:    S1 and S2 normal, no murmur, CVP - no.  Pressors - no  Abdomen:     Soft, no masses, no organomegaly  Genitalia:    Not done  Rectal:   not done  Extremities:   Extremities- no edema     Skin:   Intact in exposed areas . Sacral area - no reports of devcb     Neurologic:   Sedation - none -> RASS - +1 . Moves all 4s - yes. CAM-ICU - Negative for delirium . Orientation - fully oriented        LABS  PULMONARY  Recent Labs Lab 09/08/16 0815  PHART 7.440  PCO2ART 26.8*  PO2ART 56.0*  HCO3 18.2*  TCO2 19  O2SAT 90.0    CBC  Recent  Labs Lab 09/09/16 0530 09/10/16 0359 09/10/16 1322 09/11/16 0244  HGB 8.1* 7.8* 8.4* 7.9*  HCT 26.1* 24.9* 27.2* 25.2*  WBC 8.0 6.6  --  6.2  PLT 271 245  --  255    COAGULATION  Recent Labs Lab 09/08/16 0755 09/09/16 0530 09/11/16 0244  INR 1.28 1.32 1.31    CARDIAC   Recent Labs Lab 09/08/16 0755  TROPONINI 0.16*   No results for input(s): PROBNP in the last 168 hours.   CHEMISTRY  Recent Labs Lab 09/08/16 0755 09/09/16 0530 09/09/16 0845 09/10/16 0359  NA 136 138  --  136  K 3.7 3.4*  --  3.7  CL 107 110  --  107  CO2 21* 22  --  23  GLUCOSE 152* 114*  --  108*  BUN 16 10  --  9  CREATININE 1.08 0.81  --  0.89  CALCIUM 9.8 9.2  --  9.0  MG  --   --  1.8  --    Estimated Creatinine Clearance: 81.8 mL/min (by C-G formula based on SCr of 0.89 mg/dL).   LIVER  Recent Labs Lab 09/08/16 0755 09/09/16 0530 09/11/16 0244  INR 1.28 1.32 1.31     INFECTIOUS No results for input(s): LATICACIDVEN, PROCALCITON in the last 168 hours.   ENDOCRINE CBG (last 3)  No results for input(s): GLUCAP in the last 72 hours.       IMAGING x48h  - image(s) personally visualized  -   highlighted in bold IMPRESSION: 1. Positive for recurrent bilateral central and segmental acute PE with CT evidence of right heart strain (RV/LV Ratio = 1.5) consistent with at least submassive (intermediate risk) PE. The presence of right heart strain has been associated with an increased risk of morbidity and mortality. Please activate Code PE by paging [phone]. Critical Value/emergent results were called by telephone at the time of interpretation on 09/08/2016 at 9:04 am to Dr. Sherwood Gambler , who verbally acknowledged these results. 2. Patchy perihilar airspace opacities in both lower lobes and medial right upper lobe, more characteristic of infectious/inflammatory process or atypical edema than pulmonary infarct. 3. Borderline enlarged left hilar lymph node,  possibly reactive but nonspecific.   Electronically Signed   By: Lucrezia Europe M.D.   On: 09/08/2016 09:04    ASSESSMENT and PLAN  Recurrent pulmonary embolism (HCC) Rx rec:  Because of the recurrent nature of his pulmonary embolism he is now, did a lifelong anticoagulation. Despite history of recent bleed which appears to be local and rectal and given the fact GI has cleared him for anticoagulation at think it is safe to continue full dose anticoagulation but with  close monitoring. He and his wife understand the risks of bleed.   I think NOAC will have a statistically slightly low bleeding risk and he understands this but does not want to commit $140 a month. However his wife feels  that the cost is not worth the risk of rebleed and she is okay to spend higher amount for NOAC. I will ask for care management consultation to see if he can get NOAC for a cheaper price  Also as time passes on of bleeding risk continues to be a problem and option would be to continue full dose antibiotic regulation for 6-12 months and then subsequently based on d-dimer monitoring switch to lower dose anticoagulation (either half strength NOAC or coumadin for INR goal > 1.5)  I think systemic or local lysis at this point is too risky and he is improving anyways so we should just use it as rescue Rx  Also I do not think there is a role for IVC filter at this point but we could address if he has bleeding  Outpatient pulmonary follow-up by calling [phone] is recommended; I have left message with my office to arrange this   pccm will sign off   FAMILY  - Updates: 09/11/2016 --> wiofe and he updated  - Inter-disciplinary family meet or Palliative Care meeting due by:  DAy 7. Current LOS is LOS 3 days  CODE STATUS    Code Status Orders        Start     Ordered   09/08/16 0955  Full code  Continuous     09/08/16 0957    Code Status History    Date Active Date Inactive Code Status Order ID  Comments User Context   This patient has a current code status but no historical code status.             Dr. Brand Males, M.D., [facility].C.P Pulmonary and Critical Care Medicine Staff Physician [place] [facility] Pager: [phone], If no answer or between  15:00h - 7:00h: call [phone]  09/11/2016 5:20 PM

## 2016-09-11 NOTE — Telephone Encounter (Signed)
Francisco Rogers  pls give firsr avail to see me for PE followup  Thanks  Dr. Brand Males, M.D., [facility].C.P Pulmonary and Critical Care Medicine Staff Physician [place] [facility] Pager: [phone], If no answer or between  15:00h - 7:00h: call [phone]  09/11/2016 6:06 PM

## 2016-09-11 NOTE — Progress Notes (Signed)
Progress Note  Patient Name: Francisco Rogers Date of Encounter: 09/11/2016  Primary Cardiologist: New to Dr. Gwenlyn Found  Subjective   Dyspnea improving. No chest pain.   Inpatient Medications    Scheduled Meds: . carvedilol  12.5 mg Oral BID WC  . coumadin book   Does not apply Once  . lisinopril  5 mg Oral Daily  . magnesium oxide  400 mg Oral BID  . warfarin  7.5 mg Oral ONCE-1800  . warfarin   Does not apply Once  . Warfarin - Pharmacist Dosing Inpatient   Does not apply q1800   Continuous Infusions: . heparin 1,400 Units/hr (09/10/16 1635)   PRN Meds:    Vital Signs    Vitals:   09/10/16 1556 09/10/16 2235 09/11/16 0030 09/11/16 0700  BP: 120/71 104/73 129/88 119/83  Pulse: 95 96 94 81  Resp: (!) 26 (!) 21 (!) 30 (!) 24  Temp: 98.5 F (36.9 C) 98.7 F (37.1 C) 98.6 F (37 C) 98.7 F (37.1 C)  TempSrc: Oral Oral Oral Oral  SpO2: 100% 100% 99% 100%  Weight:      Height:        Intake/Output Summary (Last 24 hours) at 09/11/16 0846 Last data filed at 09/11/16 1829  Gross per 24 hour  Intake           667.83 ml  Output             1100 ml  Net          -432.17 ml   Filed Weights   09/08/16 0751 09/09/16 0438 09/10/16 0513  Weight: 217 lb (98.4 kg) 211 lb 6.4 oz (95.9 kg) 209 lb 8 oz (95 kg)    Telemetry    NST with ventricular bigeminy and PVCs - Personally Reviewed  ECG    N/A  Physical Exam   GEN: No acute distress.   Neck: No JVD Cardiac: RRR, no murmurs, rubs, or gallops.  Respiratory: Clear to auscultation bilaterally. GI: Soft, nontender, non-distended  MS: No edema; No deformity. Neuro:  Nonfocal  Psych: Normal affect   Labs    Chemistry Recent Labs Lab 09/08/16 0755 09/09/16 0530 09/10/16 0359  NA 136 138 136  K 3.7 3.4* 3.7  CL 107 110 107  CO2 21* 22 23  GLUCOSE 152* 114* 108*  BUN 16 10 9   CREATININE 1.08 0.81 0.89  CALCIUM 9.8 9.2 9.0  GFRNONAA >60 >60 >60  GFRAA >60 >60 >60  ANIONGAP 8 6 6      Hematology Recent  Labs Lab 09/09/16 0530 09/10/16 0359 09/10/16 1322 09/11/16 0244  WBC 8.0 6.6  --  6.2  RBC 3.88* 3.70*  --  3.75*  HGB 8.1* 7.8* 8.4* 7.9*  HCT 26.1* 24.9* 27.2* 25.2*  MCV 67.3* 67.3*  --  67.2*  MCH 20.9* 21.1*  --  21.1*  MCHC 31.0 31.3  --  31.3  RDW 17.1* 16.9*  --  16.9*  PLT 271 245  --  255    Cardiac Enzymes Recent Labs Lab 09/08/16 0755  TROPONINI 0.16*   No results for input(s): TROPIPOC in the last 168 hours.   BNP Recent Labs Lab 09/08/16 0755  BNP 319.9*     DDimer No results for input(s): DDIMER in the last 168 hours.   Radiology    No results found.  Cardiac Studies   Echo 09/09/16 Study Conclusions  - Left ventricle: The cavity size was normal. Wall thickness was  increased in a pattern of mild LVH. Systolic function was   moderately to severely reduced. The estimated ejection fraction   was in the range of 30% to 35%. Diffuse hypokinesis. The study is   not technically sufficient to allow evaluation of LV diastolic   function. - Ventricular septum: The contour showed diastolic flattening. - Aortic valve: Mildly calcified annulus. Trileaflet. - Mitral valve: Calcified annulus. - Right ventricle: The cavity size was mildly dilated. Systolic   function was moderately reduced. - Atrial septum: No defect or patent foramen ovale was identified. - Tricuspid valve: There was mild regurgitation. - Pulmonary arteries: PA peak pressure: 39 mm Hg (S). - Pericardium, extracardiac: There was no pericardial effusion.  Impressions:  - Mild LVH with LVEF approximately 30-35% and diffuse hypokinesis.   Indeterminate diastolic function. Mildly calcified mitral   annulus. Mildly dilated right ventricle with moderately reduced   contraction. Mild tricuspid regurgitation with PASP estimated 39   mmHg.  Patient Profile     Francisco Rogers is a 76 year old gentleman without prior cardiac history who had large pulmonary embolus back in December of last year  treated at [facility]. He was on Coumadin anticoagulation. His other problems include hypertension. His was discontinued because of GI bleed earlier this month and he was admitted on 09/08/16 with recurrent pulmonary emboli and right heart strain. He was placed on IV heparin. Hemoglobin is low at 7.8.   Assessment & Plan    1. Acute systolic CHF - EF of 36-64% this admission. It was 45% on 06/2016. Euvolemic. BNP 319. Continue BB and ACE. Plan for outpatient stress test. 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    2. Recurrent pulmonary emboli and right heart strain - While off coumadin due GI bleed. Now on heparin -->coumadin bridge per pharmacy. May be a candidate for an IVC filter --> consider PCCM consult.   3. Anemia - Seen by GI. Stool guaiac negative this admission. On anticoagulation. Follow closely.    4. HTN - Stable Signed, Bhagat,Bhavinkumar, PA  09/11/2016, 8:46 AM    Agree with note by Vin Bhagat PA-C  Pt started on Coumadin AC per pharm. On IV hep now. No CP/SOB. Will titrate BB as OP. Can DC when INR > 2.5 We will follow up as OP.    Lorretta Harp, M.D., [place], [facility], [place] [address] [place], [place]  [id number]  [phone] 09/11/2016 2:36 PM

## 2016-09-11 NOTE — Progress Notes (Addendum)
PROGRESS NOTE    ADIL TUGWELL  XQJ:[id number] DOB: 03/23/1941 DOA: 09/08/2016 PCP: No primary care provider on file.   Brief Narrative: Francisco Rogers is a 76 y.o. male with medical history significant for bilateral PE diagnosed in December of 2017, HTN, Osteoarthritis and prostate CA cancer   Assessment & Plan:   Principal Problem:   Pulmonary embolism, bilateral (HCC) Active Problems:   Chronic systolic heart failure ([place])   Acute respiratory failure with hypoxia ([place])   Hypoxia   Tachycardia   Pulmonary embolism, bilateral Recurrent. Taken off coumadin for one week for GI bleed. Right heart strain read on CT, however, none seen on echocardiogram -continue heparin -transition to coumadin today  Tachycardia Secondary to acute PE. Improved.  Acute respiratory failure with hypoxia Dyspnea Secondary to PE -wean O2 to room air  Chronic systolic heart failure EF of 30-35% with diffuse hypokinesis on echo performed 09/09/2016. No history of drug use, significant alcohol use, heart attack. -daily weights -continue coreg -continue lisinopril, will need BMP checked in 2 weeks -cardiology recommendations: outpatient follow-up  GI bleeding Fecal occult test negative. GI consulted. No current evidence of bleeding and hemoglobin stable. -GI recommendations: conservative management for now -daily CBC  Anemia secondary to acute blood loss Has been stable. No evidence of active bleeding -Daily CBC -If stays below 8 tomorrow, will  History of prostate cancer S/p seed radiation. Follows in [employer]. PSA 0.02  Hypokalemia Mild. Resolved. -keep potassium >4 -mag oxide 400mg  BID x2 days to keep magnesium >2 -recheck BMP/Mag in AM   DVT prophylaxis: Heparin drip Code Status: Full code Family Communication: None at bedside Disposition Plan: Discharge home pending transition to oral anticoagulation   Consultants:   Gastroenterology, Dr. Amedeo Plenty  Cardiology  [facility])  Procedures:   Echocardiogram (09/09/2016)  LE venous duplex (09/09/2016)  Antimicrobials:   None    Subjective: Patient reports dyspnea. No chest pain or abdominal pain. No palpitations. No swelling. Overnight was uneventful  Objective: Vitals:   09/10/16 1154 09/10/16 1556 09/10/16 2235 09/11/16 0030  BP: 135/88 120/71 104/73 129/88  Pulse: (!) 112 95 96 94  Resp: 18 (!) 26 (!) 21 (!) 30  Temp: 98.5 F (36.9 C) 98.5 F (36.9 C) 98.7 F (37.1 C) 98.6 F (37 C)  TempSrc: Oral Oral Oral Oral  SpO2: 100% 100% 100% 99%  Weight:      Height:        Intake/Output Summary (Last 24 hours) at 09/11/16 0756 Last data filed at 09/11/16 0600  Gross per 24 hour  Intake           667.83 ml  Output              750 ml  Net           -82.17 ml   Filed Weights   09/08/16 0751 09/09/16 0438 09/10/16 0513  Weight: 98.4 kg (217 lb) 95.9 kg (211 lb 6.4 oz) 95 kg (209 lb 8 oz)    Examination:  General exam: Appears calm and comfortable Respiratory system: Clear to auscultation. Respiratory effort normal. Cardiovascular system: S1 & S2 heard, regular rate, regular rhythm. No murmurs. Gastrointestinal system: Abdomen is nondistended, soft and nontender. Normal bowel sounds heard. Central nervous system: Alert and oriented. No focal neurological deficits. Extremities: No edema. No calf tenderness Skin: No cyanosis. No rashes Psychiatry: Judgement and insight appear normal. Mood & affect appropriate.     Data Reviewed: I have personally reviewed following labs  and imaging studies  CBC:  Recent Labs Lab 09/08/16 0755 09/08/16 1801 09/09/16 0530 09/10/16 0359 09/10/16 1322 09/11/16 0244  WBC 7.6  --  8.0 6.6  --  6.2  NEUTROABS 4.8  --   --   --   --   --   HGB 8.8* 8.2* 8.1* 7.8* 8.4* 7.9*  HCT 27.5* 26.4* 26.1* 24.9* 27.2* 25.2*  MCV 67.1*  --  67.3* 67.3*  --  67.2*  PLT 315  --  271 245  --  992   Basic Metabolic Panel:  Recent Labs Lab 09/08/16 0755  09/09/16 0530 09/09/16 0845 09/10/16 0359  NA 136 138  --  136  K 3.7 3.4*  --  3.7  CL 107 110  --  107  CO2 21* 22  --  23  GLUCOSE 152* 114*  --  108*  BUN 16 10  --  9  CREATININE 1.08 0.81  --  0.89  CALCIUM 9.8 9.2  --  9.0  MG  --   --  1.8  --    GFR: Estimated Creatinine Clearance: 81.8 mL/min (by C-G formula based on SCr of 0.89 mg/dL). Liver Function Tests: No results for input(s): AST, ALT, ALKPHOS, BILITOT, PROT, ALBUMIN in the last 168 hours. No results for input(s): LIPASE, AMYLASE in the last 168 hours. No results for input(s): AMMONIA in the last 168 hours. Coagulation Profile:  Recent Labs Lab 09/08/16 0755 09/09/16 0530 09/11/16 0244  INR 1.28 1.32 1.31   Cardiac Enzymes:  Recent Labs Lab 09/08/16 0755  TROPONINI 0.16*   BNP (last 3 results) No results for input(s): PROBNP in the last 8760 hours. HbA1C:  Recent Labs  09/08/16 1801  HGBA1C 5.6   CBG:  Recent Labs Lab 09/08/16 1232  GLUCAP 108*   Lipid Profile: No results for input(s): CHOL, HDL, LDLCALC, TRIG, CHOLHDL, LDLDIRECT in the last 72 hours. Thyroid Function Tests: No results for input(s): TSH, T4TOTAL, FREET4, T3FREE, THYROIDAB in the last 72 hours. Anemia Panel: No results for input(s): VITAMINB12, FOLATE, FERRITIN, TIBC, IRON, RETICCTPCT in the last 72 hours. Sepsis Labs: No results for input(s): PROCALCITON, LATICACIDVEN in the last 168 hours.  Recent Results (from the past 240 hour(s))  MRSA PCR Screening     Status: None   Collection Time: 09/08/16 11:56 AM  Result Value Ref Range Status   MRSA by PCR NEGATIVE NEGATIVE Final    Comment:        The GeneXpert MRSA Assay (FDA approved for NASAL specimens only), is one component of a comprehensive MRSA colonization surveillance program. It is not intended to diagnose MRSA infection nor to guide or monitor treatment for MRSA infections.          Radiology Studies: No results found.      Scheduled  Meds: . carvedilol  12.5 mg Oral BID WC  . coumadin book   Does not apply Once  . lisinopril  5 mg Oral Daily  . magnesium oxide  400 mg Oral BID  . warfarin  7.5 mg Oral ONCE-1800  . warfarin   Does not apply Once  . Warfarin - Pharmacist Dosing Inpatient   Does not apply q1800   Continuous Infusions: . heparin 1,400 Units/hr (09/10/16 1635)     LOS: 3 days     Cordelia Poche, MD [facility] 09/11/2016, 7:56 AM Pager: [phone]  If 7PM-7AM, please contact night-coverage [email or web address] Password [facility] 09/11/2016, 7:56 AM

## 2016-09-11 NOTE — Progress Notes (Signed)
Will sing off. Call with questions. Outpatient follow up has been arranged. Continue current dose of BB and ACE.

## 2016-09-12 LAB — MAGNESIUM: MAGNESIUM: 1.9 mg/dL (ref 1.7–2.4)

## 2016-09-12 LAB — CBC
HEMATOCRIT: 25.6 % — AB (ref 39.0–52.0)
Hemoglobin: 7.9 g/dL — ABNORMAL LOW (ref 13.0–17.0)
MCH: 20.6 pg — ABNORMAL LOW (ref 26.0–34.0)
MCHC: 30.9 g/dL (ref 30.0–36.0)
MCV: 66.8 fL — AB (ref 78.0–100.0)
Platelets: 262 10*3/uL (ref 150–400)
RBC: 3.83 MIL/uL — ABNORMAL LOW (ref 4.22–5.81)
RDW: 16.6 % — AB (ref 11.5–15.5)
WBC: 5.7 10*3/uL (ref 4.0–10.5)

## 2016-09-12 LAB — PROTIME-INR
INR: 1.24
Prothrombin Time: 15.7 seconds — ABNORMAL HIGH (ref 11.4–15.2)

## 2016-09-12 LAB — HEPARIN LEVEL (UNFRACTIONATED): HEPARIN UNFRACTIONATED: 0.39 [IU]/mL (ref 0.30–0.70)

## 2016-09-12 LAB — BASIC METABOLIC PANEL
Anion gap: 8 (ref 5–15)
BUN: 10 mg/dL (ref 6–20)
CO2: 23 mmol/L (ref 22–32)
CREATININE: 0.68 mg/dL (ref 0.61–1.24)
Calcium: 9.4 mg/dL (ref 8.9–10.3)
Chloride: 105 mmol/L (ref 101–111)
GFR calc Af Amer: >60 mL/min (ref >60)
GFR calc non Af Amer: >60 mL/min (ref >60)
GLUCOSE: 91 mg/dL (ref 65–99)
Potassium: 3.6 mmol/L (ref 3.5–5.1)
SODIUM: 136 mmol/L (ref 135–145)

## 2016-09-12 MED ORDER — ENOXAPARIN SODIUM 100 MG/ML ~~LOC~~ SOLN: 1.0000 mg/kg | Freq: Two times a day (BID) | SUBCUTANEOUS | Status: DC

## 2016-09-12 MED ORDER — LISINOPRIL 5 MG PO TABS: 5.0000 mg | ORAL_TABLET | Freq: Every day | ORAL | 0 refills | Status: DC

## 2016-09-12 MED ORDER — ENOXAPARIN SODIUM 100 MG/ML ~~LOC~~ SOLN
1.0000 mg/kg | Freq: Two times a day (BID) | SUBCUTANEOUS | 0 refills | Status: DC
Start: 2016-09-12 — End: 2017-03-24

## 2016-09-12 MED ORDER — ENOXAPARIN (LOVENOX) PATIENT EDUCATION KIT
PACK | Freq: Once | Status: AC
  Administered 2016-09-12: 15:00:00
  Filled 2016-09-12: qty 1

## 2016-09-12 MED ORDER — WARFARIN SODIUM 7.5 MG PO TABS
7.5000 mg | ORAL_TABLET | Freq: Once | ORAL | Status: AC
  Administered 2016-09-12: 7.5 mg via ORAL
  Filled 2016-09-12: qty 1

## 2016-09-12 MED ORDER — ENOXAPARIN SODIUM 100 MG/ML ~~LOC~~ SOLN
1.0000 mg/kg | Freq: Once | SUBCUTANEOUS | Status: AC
  Administered 2016-09-12: 95 mg via SUBCUTANEOUS
  Filled 2016-09-12: qty 1

## 2016-09-12 MED ORDER — WARFARIN SODIUM 5 MG PO TABS: ORAL_TABLET | ORAL | 0 refills | Status: DC

## 2016-09-12 NOTE — Plan of Care (Signed)
Problem: Safety: Goal: Ability to remain free from injury will improve Outcome: Completed/Met Date Met: 09/12/16 Pt calls before getting up and knows that he does need assistance getting up d/t O2, IV pump and monitor cords.

## 2016-09-12 NOTE — Progress Notes (Signed)
ANTICOAGULATION CONSULT NOTE - Initial Consult  Pharmacy Consult for Lovenox>Coumadin Indication: pulmonary embolus  Switching to lovenox this am. Will dose at 17m/kg q12 hours. Do not feel that 1.572mkg/day is best idea with his recent bleeding. Will provide lovenox teaching kit and nurse can provide hands on education. Patient is on board with coumadin/lovenox and is not bothered by increased monitoring since he is retired.   Patient tells me he was taking 46m52maily of warfarin prior to stopping due to bleeding.   Would continue warfarin 7.46mg57mily until seen at coumadin clinic in 2 days. Would prescribe 46mg 84mlets as imagine he will end up on a regimen very close to this after he stabilizes. Expect him to need ~ 5 days of lovenox overlap (10 syringes).   Francisco HearingmD., BCPS Clinical Pharmacist Phone [phone] 12:51 PM

## 2016-09-12 NOTE — Discharge Summary (Signed)
Physician Discharge Summary  Francisco Rogers EQA:[id number] DOB: 1940-10-26 DOA: 09/08/2016  PCP: No primary care provider on file. Follows with [facility].   Admit date: 09/08/2016 Discharge date: 09/12/2016  Admitted From: Home Disposition:  Home  Recommendations for Outpatient Follow-up:  1. Follow up with PCP in 1 week at [facility] in [facility] 2. Follow up with Coumadin Clinic at your PCP office in 2 days  3. Follow-up with pulmonology (Dr. Chase Caller) in 1 week  4. Follow up with GI (Dr. Harrell Lark) in 1 week  5. Follow up with cardiology (Dr. Dellia Beckwith PA) as scheduled on 10/02/2016 6. Please obtain CBC/BMP in 1 week  7. Take coumadin 7.5mg  (1.5 tablet) on 3/14 and 3/15. Follow up with your coumadin clinic by 3/16 for further recommendations on titrating your coumadin dose. Take lovenox 95mg  every 12 hours daily until further recommendations and instructions from coumadin clinic.   Home Health: No  Equipment/Devices: None   Discharge Condition: Stable CODE STATUS: Full  Diet recommendation: Heart healthy   Brief/Interim Summary: Francisco Rogers is a 76 y.o. male with medical history significant for bilateral PE diagnosed in December of 2017, HTN, Osteoarthritis and prostate CA cancer presented to the [facility] with c/o progressive SOB over the period of 3-4 days. Patient was placed on Coumadin after initial diagnosis of bilateral PE, but it was discontinued due to him developing rectal bleeding approximately 8 days ago having few episodes of bloody stools. The colonoscopy was scheduled with Dr. Hassell Done on 09/10/2016, however due to worsening of dyspnea patient presented to the ED for evaluation. Patient denied chest pain, palpitations, abdominal pain, nausea, diarrhea or any more bloody stools since the discontinuation of Coumadin. In the Emergency department, patient was found to be tachycardic, tachypnea, and hypoxemic with SPO2 75% on  room air, which improved with oxygen administration. CTA chest showed recurrent bilateral central and segmental acute PE with CT evidence of right heart strain. He was started on heparin drip and GI was consulted due to previous history of rectal bleeding. GI has recommended to restart anticoagulation and follow-up with primary GI after discharge. Due to patient's chronic systolic heart failure, cardiology was consulted. They evaluated patient and recommend future cardiac evaluation. Ultrasound of bilateral extremities was negative for DVT. He was initially evaluated for possible IVC filter and was evaluated by interventional radiology, however, as patient is okay to restart anticoagulation, IVC filter is deferred. Case management looked into cost for NOAC inpatient team that it was still too expensive to afford on a monthly basis. He did prefer Coumadin. He is discharged with Lovenox bridge with Coumadin and follow closely with his Coumadin clinic at PCP office.  Discharge Diagnoses:  Principal Problem:   Recurrent pulmonary embolism (HCC) Active Problems:   Pulmonary embolism, bilateral (HCC)   Hypoxia   Tachycardia   Chronic systolic heart failure (HCC)   Acute respiratory failure with hypoxia (HCC)  Bilateral recurrent pulmonary embolism -Was taken off Coumadin for 1 week only. Now with recurrent pulmonary embolism with right heart strain on CT -Echocardiogram as below -Pulmonary consulted. IR consulted for IVC filter evaluation. As he is okay to restart anticoagulation from GI , defer IVC placement for now  -Discussed with case management, as well as patient. Patient is reluctant to start NOAC due to high cost. He prefers Coumadin. We will arrange for Lovenox and Coumadin bridge and patient may follow his Coumadin clinic at [facility]  Acute respiratory failure with hypoxia -Due to above -Now on room air  Chronic systolic heart failure -EF 30-35% with diffuse  hypokinesis -Cardiology consulted -Patient needs outpatient follow-up for further workup -Lisinopril, Coreg   GI bleed -GI consulted -No current evidence of bleeding and hemoglobin is stable -Okay to restart anticoagulation from GI standpoint -Need follow-up with GI as outpatient  History of prostate cancer -S/p seed radiation. Follows in [employer]. PSA 0.02  Discharge Instructions  Discharge Instructions    Call MD for:  difficulty breathing, headache or visual disturbances    Complete by:  As directed    Call MD for:  extreme fatigue    Complete by:  As directed    Call MD for:  persistant dizziness or light-headedness    Complete by:  As directed    Call MD for:  persistant nausea and vomiting    Complete by:  As directed    Call MD for:  temperature >100.4    Complete by:  As directed    Diet - low sodium heart healthy    Complete by:  As directed    Discharge instructions    Complete by:  As directed    Take coumadin 7.5mg  (1.5 tablet) on 3/14 and 3/15. Follow up with your coumadin clinic by 3/16 for further recommendations on titrating your coumadin dose. Take lovenox 95mg  every 12 hours daily until further recommendations and instructions from coumadin clinic.   Increase activity slowly    Complete by:  As directed      Allergies as of 09/12/2016   No Known Allergies     Medication List    TAKE these medications   acetaminophen 325 MG tablet Commonly known as:  TYLENOL Take 325-650 mg by mouth every 6 (six) hours as needed (for pain).   carvedilol 12.5 MG tablet Commonly known as:  COREG Take 12.5 mg by mouth 2 (two) times daily with a meal.   enoxaparin 100 MG/ML injection Commonly known as:  LOVENOX Inject 0.95 mLs (95 mg total) into the skin every 12 (twelve) hours.   lisinopril 5 MG tablet Commonly known as:  PRINIVIL,ZESTRIL Take 1 tablet (5 mg total) by mouth daily. Start taking on:  09/13/2016   metoCLOPramide 5 MG tablet Commonly known as:   REGLAN Take 5 mg by mouth at bedtime.   ONE-A-DAY MENS 50+ ADVANTAGE Tabs Take 1 tablet by mouth daily.   warfarin 5 MG tablet Commonly known as:  COUMADIN Take 7.5mg  (1.5 tablet) on 3/14 and 3/15 until follow up at coumadin clinic. Follow instructions on titrating coumadin dose What changed:  how much to take  how to take this  when to take this  additional instructions      Follow-up Information    Quay Burow, MD. Go on 10/23/2016.   Specialties:  Cardiology, Radiology Why:  @10 :40am for 6 weeks follow up Contact information: [address] [place] [address] [place], [place]. Go on 10/02/2016.   Specialties:  Cardiology, Radiology Why:  @10am  for hospital follow up with Dr. Kennon Holter PA Contact information: [address] [place] [address] [phone]        PCP at [facility] in [place]. Schedule an appointment as soon as possible for a visit in 1 week(s).        Coumadin Clinic at [facility] in [facility] Follow up in 2 day(s).  RAMASWAMY,MURALI, MD. Schedule an appointment as soon as possible for a visit in 1 week(s).   Specialty:  Pulmonary Disease Contact information: [place] [address] [phone]        Dr. Vennie Homans. Schedule an appointment as soon as possible for a visit in 1 week(s).   Contact information: [facility]         No Known Allergies  Consultations:  Cardiology  GI  IR  Pulmonology    Procedures/Studies: Ct Angio Chest Pe W And/or Wo Contrast  Result Date: 09/08/2016 CLINICAL DATA:  Pt with increased SOB, recent hx PE on blood thinners but stopped due to some rectal bleeding, no chest pain HTN, prostate cancer- seeds, PE Bun/creat within normal limits EXAM: CT ANGIOGRAPHY CHEST WITH CONTRAST TECHNIQUE: Multidetector CT imaging of the chest was performed using the standard protocol during  bolus administration of intravenous contrast. Multiplanar CT image reconstructions and MIPs were obtained to evaluate the vascular anatomy. CONTRAST:  100 mL isovue 370, pt nauseous after scan COMPARISON:  06/19/2016 FINDINGS: Cardiovascular: Left arm IV contrast administration. Innominate vein and SVC patent. Mild right atrial enlargement. Dilated right ventricle, RV/ LV ratio 1.5. There is good contrast opacification of pulmonary artery. Bilateral central and segmental pulmonary emboli involving upper and lower lobe branches. Some are clearly new since previous examination (which also demonstrated bilateral PE). Patent bilateral pulmonary veins drain into the left atrium. Adequate contrast opacification of the thoracic aorta with no evidence of dissection, aneurysm, or stenosis. There is classic 3-vessel brachiocephalic arch anatomy without proximal stenosis. Scattered plaque in the distal arch. Mediastinum/Nodes: Enlarged 13 mm left hilar node, along with subcentimeter prevascular, pretracheal, and precarinal lymph nodes as before. No mediastinal hematoma. No pericardial effusion. Lungs/Pleura: Bilateral lower lobe Infrahilar airspace consolidation right worse than left, new since prior study. Patchy infiltrate in the medial right upper lobe suprahilar region, new since previous. No pleural effusion. No pneumothorax. Upper Abdomen: No acute findings.  Stable probable hepatic cysts. Musculoskeletal: Mild spondylitic changes in the mid and lower thoracic spine. No fracture or or worrisome bone lesion. Review of the MIP images confirms the above findings. IMPRESSION: 1. Positive for recurrent bilateral central and segmental acute PE with CT evidence of right heart strain (RV/LV Ratio = 1.5) consistent with at least submassive (intermediate risk) PE. The presence of right heart strain has been associated with an increased risk of morbidity and mortality. Please activate Code PE by paging [phone]. Critical  Value/emergent results were called by telephone at the time of interpretation on 09/08/2016 at 9:04 am to Dr. Sherwood Gambler , who verbally acknowledged these results. 2. Patchy perihilar airspace opacities in both lower lobes and medial right upper lobe, more characteristic of infectious/inflammatory process or atypical edema than pulmonary infarct. 3. Borderline enlarged left hilar lymph node, possibly reactive but nonspecific. Electronically Signed   By: Lucrezia Europe M.D.   On: 09/08/2016 09:04   Dg Chest Port 1 View  Result Date: 09/08/2016 CLINICAL DATA:  Shortness of Breath EXAM: PORTABLE CHEST 1 VIEW COMPARISON:  06/19/2016 FINDINGS: The heart size and mediastinal contours are within normal limits. Both lungs are clear. The visualized skeletal structures are unremarkable. IMPRESSION: No active disease. Electronically Signed   By: Inez Catalina M.D.   On: 09/08/2016 08:34    Echo 09/09/2016 Impressions: - Mild LVH with LVEF approximately 30-35% and diffuse hypokinesis.   Indeterminate diastolic function. Mildly calcified mitral   annulus. Mildly dilated right ventricle with  moderately reduced   contraction. Mild tricuspid regurgitation with PASP estimated 39   mmHg.   Discharge Exam: Vitals:   09/12/16 0500 09/12/16 0758  BP:  140/78  Pulse:  85  Resp: (!) 21 20  Temp:  98.6 F (37 C)   Vitals:   09/12/16 0400 09/12/16 0430 09/12/16 0500 09/12/16 0758  BP:  117/65  140/78  Pulse:  90  85  Resp: 17 18 (!) 21 20  Temp:  99.1 F (37.3 C)  98.6 F (37 C)  TempSrc:  Oral  Oral  SpO2:  98%  99%  Weight:  94.5 kg (208 lb 6.4 oz)    Height:        General: Pt is alert, awake, not in acute distress Cardiovascular: RRR, S1/S2 +, no rubs, no gallops Respiratory: CTA bilaterally, no wheezing, no rhonchi Abdominal: Soft, NT, ND, bowel sounds + Extremities: no edema, no cyanosis    The results of significant diagnostics from this hospitalization (including imaging, microbiology,  ancillary and laboratory) are listed below for reference.     Microbiology: Recent Results (from the past 240 hour(s))  MRSA PCR Screening     Status: None   Collection Time: 09/08/16 11:56 AM  Result Value Ref Range Status   MRSA by PCR NEGATIVE NEGATIVE Final    Comment:        The GeneXpert MRSA Assay (FDA approved for NASAL specimens only), is one component of a comprehensive MRSA colonization surveillance program. It is not intended to diagnose MRSA infection nor to guide or monitor treatment for MRSA infections.      Labs: BNP (last 3 results)  Recent Labs  09/08/16 0755  BNP 417.4*   Basic Metabolic Panel:  Recent Labs Lab 09/08/16 0755 09/09/16 0530 09/09/16 0845 09/10/16 0359 09/12/16 0350  NA 136 138  --  136 136  K 3.7 3.4*  --  3.7 3.6  CL 107 110  --  107 105  CO2 21* 22  --  23 23  GLUCOSE 152* 114*  --  108* 91  BUN 16 10  --  9 10  CREATININE 1.08 0.81  --  0.89 0.68  CALCIUM 9.8 9.2  --  9.0 9.4  MG  --   --  1.8  --  1.9   Liver Function Tests: No results for input(s): AST, ALT, ALKPHOS, BILITOT, PROT, ALBUMIN in the last 168 hours. No results for input(s): LIPASE, AMYLASE in the last 168 hours. No results for input(s): AMMONIA in the last 168 hours. CBC:  Recent Labs Lab 09/08/16 0755  09/09/16 0530 09/10/16 0359 09/10/16 1322 09/11/16 0244 09/12/16 0350  WBC 7.6  --  8.0 6.6  --  6.2 5.7  NEUTROABS 4.8  --   --   --   --   --   --   HGB 8.8*  < > 8.1* 7.8* 8.4* 7.9* 7.9*  HCT 27.5*  < > 26.1* 24.9* 27.2* 25.2* 25.6*  MCV 67.1*  --  67.3* 67.3*  --  67.2* 66.8*  PLT 315  --  271 245  --  255 262  < > = values in this interval not displayed. Cardiac Enzymes:  Recent Labs Lab 09/08/16 0755  TROPONINI 0.16*   BNP: Invalid input(s): POCBNP CBG:  Recent Labs Lab 09/08/16 1232  GLUCAP 108*   D-Dimer No results for input(s): DDIMER in the last 72 hours. Hgb A1c No results for input(s): HGBA1C in the last 72  hours. Lipid  Profile No results for input(s): CHOL, HDL, LDLCALC, TRIG, CHOLHDL, LDLDIRECT in the last 72 hours. Thyroid function studies No results for input(s): TSH, T4TOTAL, T3FREE, THYROIDAB in the last 72 hours.  Invalid input(s): FREET3 Anemia work up No results for input(s): VITAMINB12, FOLATE, FERRITIN, TIBC, IRON, RETICCTPCT in the last 72 hours. Urinalysis No results found for: COLORURINE, APPEARANCEUR, [place], [place], [place], [place], [place], [place], PROTEINUR, UROBILINOGEN, NITRITE, LEUKOCYTESUR Sepsis Labs Invalid input(s): PROCALCITONIN,  WBC,  LACTICIDVEN Microbiology Recent Results (from the past 240 hour(s))  MRSA PCR Screening     Status: None   Collection Time: 09/08/16 11:56 AM  Result Value Ref Range Status   MRSA by PCR NEGATIVE NEGATIVE Final    Comment:        The GeneXpert MRSA Assay (FDA approved for NASAL specimens only), is one component of a comprehensive MRSA colonization surveillance program. It is not intended to diagnose MRSA infection nor to guide or monitor treatment for MRSA infections.      Time coordinating discharge: 45 minutes  SIGNED:  Dessa Phi, DO [facility] Pager [phone]  If 7PM-7AM, please contact night-coverage [email or web address] Password [email or web address] 09/12/2016, 1:35 PM

## 2016-09-12 NOTE — Discharge Instructions (Signed)
Information on my medicine - Coumadin   (Warfarin)  This medication education was reviewed with me or my healthcare representative as part of my discharge preparation.  The pharmacist that spoke with me during my hospital stay was:  Georgina Peer, [facility]  Why was Coumadin prescribed for you? Coumadin was prescribed for you because you have a blood clot or a medical condition that can cause an increased risk of forming blood clots. Blood clots can cause serious health problems by blocking the flow of blood to the heart, lung, or brain. Coumadin can prevent harmful blood clots from forming. As a reminder your indication for Coumadin is:   Pulmonary Embolism Treatment  What test will check on my response to Coumadin? While on Coumadin (warfarin) you will need to have an INR test regularly to ensure that your dose is keeping you in the desired range. The INR (international normalized ratio) number is calculated from the result of the laboratory test called prothrombin time (PT).  If an INR APPOINTMENT HAS NOT ALREADY BEEN MADE FOR YOU please schedule an appointment to have this lab work done by your health care provider within 7 days. Your INR goal is usually a number between:  2 to 3 or your provider may give you a more narrow range like 2-2.5.  Ask your health care provider during an office visit what your goal INR is.  What  do you need to  know  About  COUMADIN? Take Coumadin (warfarin) exactly as prescribed by your healthcare provider about the same time each day.  DO NOT stop taking without talking to the doctor who prescribed the medication.  Stopping without other blood clot prevention medication to take the place of Coumadin may increase your risk of developing a new clot or stroke.  Get refills before you run out.  What do you do if you miss a dose? If you miss a dose, take it as soon as you remember on the same day then continue your regularly scheduled regimen the next day.  Do not take  two doses of Coumadin at the same time.  Important Safety Information A possible side effect of Coumadin (Warfarin) is an increased risk of bleeding. You should call your healthcare provider right away if you experience any of the following: ? Bleeding from an injury or your nose that does not stop. ? Unusual colored urine (red or dark brown) or unusual colored stools (red or black). ? Unusual bruising for unknown reasons. ? A serious fall or if you hit your head (even if there is no bleeding).  Some foods or medicines interact with Coumadin (warfarin) and might alter your response to warfarin. To help avoid this: ? Eat a balanced diet, maintaining a consistent amount of Vitamin K. ? Notify your provider about major diet changes you plan to make. ? Avoid alcohol or limit your intake to 1 drink for women and 2 drinks for men per day. (1 drink is 5 oz. wine, 12 oz. beer, or 1.5 oz. liquor.)  Make sure that ANY health care provider who prescribes medication for you knows that you are taking Coumadin (warfarin).  Also make sure the healthcare provider who is monitoring your Coumadin knows when you have started a new medication including herbals and non-prescription products.  Coumadin (Warfarin)  Major Drug Interactions  Increased Warfarin Effect Decreased Warfarin Effect  Alcohol (large quantities) Antibiotics (esp. Septra/Bactrim, Flagyl, Cipro) Amiodarone (Cordarone) Aspirin (ASA) Cimetidine (Tagamet) Megestrol (Megace) NSAIDs (ibuprofen, naproxen, etc.) Piroxicam (  Feldene) °Propafenone (Rythmol SR) °Propranolol (Inderal) °Isoniazid (INH) °Posaconazole (Noxafil) Barbiturates (Phenobarbital) °Carbamazepine (Tegretol) °Chlordiazepoxide (Librium) °Cholestyramine (Questran) °Griseofulvin °Oral Contraceptives °Rifampin °Sucralfate (Carafate) °Vitamin K  ° °Coumadin® (Warfarin) Major Herbal Interactions  °Increased Warfarin Effect Decreased Warfarin Effect  °Garlic °Ginseng °Ginkgo biloba  Coenzyme Q10 °Green tea °St. John’s wort   ° °Coumadin® (Warfarin) FOOD Interactions  °Eat a consistent number of servings per week of foods HIGH in Vitamin K °(1 serving = ½ cup)  °Collards (cooked, or boiled & drained) °Kale (cooked, or boiled & drained) °Mustard greens (cooked, or boiled & drained) °Parsley *serving size only = ¼ cup °Spinach (cooked, or boiled & drained) °Swiss chard (cooked, or boiled & drained) °Turnip greens (cooked, or boiled & drained)  °Eat a consistent number of servings per week of foods MEDIUM-HIGH in Vitamin K °(1 serving = 1 cup)  °Asparagus (cooked, or boiled & drained) °Broccoli (cooked, boiled & drained, or raw & chopped) °Brussel sprouts (cooked, or boiled & drained) *serving size only = ½ cup °Lettuce, raw (green leaf, endive, romaine) °Spinach, raw °Turnip greens, raw & chopped  ° °These websites have more information on Coumadin (warfarin):  www.coumadin.com; °www.ahrq.gov/consumer/coumadin.htm; ° ° ° °

## 2016-09-12 NOTE — Progress Notes (Signed)
ANTICOAGULATION CONSULT NOTE - Initial Consult  Pharmacy Consult for Coumadin Indication: pulmonary embolus  No Known Allergies  Patient Measurements: Height: 5\' 11"  (180.3 cm) Weight: 208 lb 6.4 oz (94.5 kg) IBW/kg (Calculated) : 75.3  Vital Signs: Temp: 98.6 F (37 C) (03/14 0758) Temp Source: Oral (03/14 0758) BP: 140/78 (03/14 0758) Pulse Rate: 85 (03/14 0758)  Labs:  Recent Labs  09/10/16 0359 09/10/16 1322 09/11/16 0244 09/12/16 0350  HGB 7.8* 8.4* 7.9* 7.9*  HCT 24.9* 27.2* 25.2* 25.6*  PLT 245  --  255 262  LABPROT  --   --  16.4* 15.7*  INR  --   --  1.31 1.24  HEPARINUNFRC 0.53  --  0.45 0.39  CREATININE 0.89  --   --  0.68    Estimated Creatinine Clearance: 90.8 mL/min (by C-G formula based on SCr of 0.68 mg/dL).   Medical History: Past Medical History:  Diagnosis Date  . Cancer ([place])   . Hypertension   . Pulmonary embolism [facility])    Assessment: 76yo male admitted 3/10 for acute PE, has been on heparin gtt since admission, now bridging with Coumadin; pt was previously on Coumadin for PE Dec 2017 but was discontinued <2wk ago for rectal bleeding.  Heparin at goal this am on 1400 units/hr. INR down this am 1.3>1.2 after warfarin restarted last night. No bleeding issues noted, hgb low but stable at 7.9.   Goal of Therapy:  INR 2-3 Heparin level 0.3-0.7 units/ml     Plan:  Warfarin 7.5mg  tonight Continue heparin at 1400 units/hr Daily heparin level, cbc, INR  Erin Hearing PharmD., BCPS Clinical Pharmacist Pager [phone] 09/12/2016 10:58 AM

## 2016-09-12 NOTE — Care Management Important Message (Signed)
Important Message  Patient Details  Name: Francisco Rogers MRN: [id number] Date of Birth: 1941-03-28   Medicare Important Message Given:  Yes    [name] May 09/12/2016, 11:34 AM

## 2016-09-12 NOTE — Care Management Note (Addendum)
Case Management Note  Patient Details  Name: Francisco Rogers MRN: [id number] Date of Birth: [birth year]  Subjective/Objective: Pt presented for recurrent pulmonary embolism. Pt is from home and plan will be to return home at dc.                  Action/Plan: Benefits check completed for Xarelto and Eliquis:  Xarelto 20mg  PO Daily ( No Prior Auth Needed) Copay $88   Eliquis 2.5mg , 5mg  ( No Prior Auth Needed) Copay $124   Pt is aware of cost and felt too expensive. Pt wishes to continue on Coumadin. CM did make MD aware and plan will be to d/c on Coumadin Lovenox bridge. Pt states he will be able to do injections with teaching before d/c. CM has benefits check in process for Lovenox. Will make patient aware.  Expected Discharge Date:                  Expected Discharge Plan:  Home/Self Care  In-House Referral:  NA  Discharge planning Services  CM Consult, Medication Assistance  Post Acute Care Choice:  NA Choice offered to:  NA  DME Arranged:  N/A DME Agency:  NA  HH Arranged:  NA HH Agency:  NA  Status of Service:  Completed, signed off  If discussed at [place] of Stay Meetings, dates discussed:    Additional Comments: 1435 09-12-16 Co pay for Lovenox: will be $20.30- prior auth not required. CM will make patient aware. CM did call [facility] and medication not available. [name] did have medication available.  No further needs from CM at this time.  Bethena Roys, RN 09/12/2016, 11:57 AM

## 2016-09-13 NOTE — Telephone Encounter (Signed)
Patient's Domingo Sep, patient's daughter, calling to schedule hospital follow up for patient within 1 week per hospital discharge papers.  First thing on the schedule is May 2018.  Please call her at [phone] home), Nantia's cell [phone].

## 2016-09-13 NOTE — Telephone Encounter (Signed)
First available with MR is early May 2018.   MR please advise if this is ok for HFU. Thanks.

## 2016-09-14 NOTE — Telephone Encounter (Signed)
Called and spoke with pt and he is aware of appt with TP on 4/3 at 1130. Nothing further is needed.

## 2016-09-14 NOTE — Telephone Encounter (Signed)
He can see an APP in 2 weeks and then me after that  Dr. Brand Males, M.D., [facility].C.P Pulmonary and Critical Care Medicine Staff Physician [facility] Pager: [phone], If no answer or between  15:00h - 7:00h: call [phone]  09/14/2016 4:40 PM

## 2016-10-02 ENCOUNTER — Ambulatory Visit (INDEPENDENT_AMBULATORY_CARE_PROVIDER_SITE_OTHER): Payer: Medicare Other | Admitting: Cardiology

## 2016-10-02 ENCOUNTER — Ambulatory Visit (INDEPENDENT_AMBULATORY_CARE_PROVIDER_SITE_OTHER): Payer: Medicare Other | Admitting: Adult Health

## 2016-10-02 ENCOUNTER — Encounter: Payer: Self-pay | Admitting: Adult Health

## 2016-10-02 ENCOUNTER — Encounter: Payer: Self-pay | Admitting: Cardiology

## 2016-10-02 VITALS — BP 138/85 | HR 98 | Ht 71.0 in | Wt 214.6 lb

## 2016-10-02 DIAGNOSIS — I2699 Other pulmonary embolism without acute cor pulmonale: Secondary | ICD-10-CM | POA: Diagnosis not present

## 2016-10-02 DIAGNOSIS — I420 Dilated cardiomyopathy: Secondary | ICD-10-CM | POA: Diagnosis not present

## 2016-10-02 DIAGNOSIS — Z7901 Long term (current) use of anticoagulants: Secondary | ICD-10-CM

## 2016-10-02 DIAGNOSIS — I429 Cardiomyopathy, unspecified: Secondary | ICD-10-CM | POA: Diagnosis not present

## 2016-10-02 DIAGNOSIS — D649 Anemia, unspecified: Secondary | ICD-10-CM | POA: Diagnosis not present

## 2016-10-02 MED ORDER — LISINOPRIL 10 MG PO TABS: 10.0000 mg | ORAL_TABLET | Freq: Every day | ORAL | 3 refills | Status: DC

## 2016-10-02 NOTE — Progress Notes (Signed)
  10/02/2016 Francisco Rogers   12/19/1940  [id number]  Primary Physician WATTERSON,PATRICK, PA-C Primary Cardiologist: Dr Berry  HPI:  75 y/o AA male with a history of prostate cancer 2015, admitted to [facility] Dec 2017 with unprovoked bilateral pulmonary embolism. He was placed on Coumadin but he had lower GI bleeding early in March and Coumadin was stopped. He then presented to [facility] 09/08/16 with recurrent bilateral PE. A repeat echo showed his EF to be 30% (global), it was 45% at [facility] in Dec 2017. The pt has no history of CAD, angina, or prior MI. His Coumadin was resumed  And he is seen in the office today for follow up. He has had no further bleeding.    Current Outpatient Prescriptions  Medication Sig Dispense Refill  . acetaminophen (TYLENOL) 325 MG tablet Take 325-650 mg by mouth every 6 (six) hours as needed (for pain).    . carvedilol (COREG) 12.5 MG tablet Take 12.5 mg by mouth 2 (two) times daily with a meal.    . lisinopril (PRINIVIL,ZESTRIL) 10 MG tablet Take 1 tablet (10 mg total) by mouth daily. 90 tablet 3  . metoCLOPramide (REGLAN) 5 MG tablet Take 5 mg by mouth at bedtime.    . Multiple Vitamins-Minerals (ONE-A-DAY MENS 50+ ADVANTAGE) TABS Take 1 tablet by mouth daily.    . SUPREP BOWEL PREP KIT 17.5-3.13-1.6 GM/180ML SOLN Take as directed for colonoscopy  0  . warfarin (COUMADIN) 5 MG tablet Take 7.5mg (1.5 tablet) on 3/14 and 3/15 until follow up at coumadin clinic. Follow instructions on titrating coumadin dose 30 tablet 0  . enoxaparin (LOVENOX) 100 MG/ML injection Inject 0.95 mLs (95 mg total) into the skin every 12 (twelve) hours. 10 mL 0   No current facility-administered medications for this visit.     No Known Allergies  Past Medical History:  Diagnosis Date  . Cancer (HCC)   . Hypertension   . Pulmonary embolism (HCC)     Social History   Social History  . Marital status: Married    Spouse name: N/A  . Number of children: N/A  . Years of  education: N/A   Occupational History  . Not on file.   Social History Main Topics  . Smoking status: Never Smoker  . Smokeless tobacco: Never Used  . Alcohol use Yes     Comment: socially  . Drug use: No  . Sexual activity: Not on file   Other Topics Concern  . Not on file   Social History Narrative  . No narrative on file     Family History  Problem Relation Age of Onset  . Heart attack Mother   . Pneumonia Father   . Diabetes Sister   . Breast cancer Sister   . CAD Brother      Review of Systems: General: negative for chills, fever, night sweats or weight changes.  Cardiovascular: negative for chest pain, dyspnea on exertion, edema, orthopnea, palpitations, paroxysmal nocturnal dyspnea or shortness of breath Dermatological: negative for rash Respiratory: negative for cough or wheezing Urologic: negative for hematuria Abdominal: negative for nausea, vomiting, diarrhea, bright red blood per rectum, melena, or hematemesis Neurologic: negative for visual changes, syncope, or dizziness All other systems reviewed and are otherwise negative except as noted above.    Blood pressure 138/85, pulse 98, height 5' 11" (1.803 m), weight 214 lb 9.6 oz (97.3 kg).  General appearance: alert, cooperative and no distress Lungs: clear to auscultation bilaterally Heart:   regular rate and rhythm Neurologic: Grossly normal   ASSESSMENT AND PLAN:   Dilated cardiomyopathy- etiology not yet determined EF at [facility] in Dec 2017 was 45%. His EF was 30-35% with diffuse hypokinesis on echo performed 09/09/2016  Recurrent pulmonary embolism (HCC) Pt had unprovoked bilateral PE Dec 2017. He had to stop Coumadin for GI bleeding and had recurrent PE March 2018  Chronic anticoagulation Back on Coumadin tolerating this well. INR followed at [facility]   PLAN  Check Lexiscan Myoview to r/o ischemic CAD as a cause of his LVD. Check echo in 3 months for LVF. Increase Lisinopril to 10  mg daily. F/U with dr Berry after his f/u echo.  Luke Kilroy PA-C 10/02/2016 10:29 AM 

## 2016-10-02 NOTE — Patient Instructions (Signed)
Medication Instructions:  INCREASE- Lisinopril 10 mg daily  Labwork: None Ordered  Testing/Procedures: Your physician has requested that you have a lexiscan myoview. For further information please visit [email or web address]. Please follow instruction sheet, as given.  Your physician has requested that you have an echocardiogram in 3 Months. Echocardiography is a painless test that uses sound waves to create images of your heart. It provides your doctor with information about the size and shape of your heart and how well your heart's chambers and valves are working. This procedure takes approximately one hour. There are no restrictions for this procedure.  Follow-Up: Your physician recommends that you schedule a follow-up appointment in: 3 Months with Dr Gwenlyn Found   Any Other Special Instructions Will Be Listed Below (If Applicable).   If you need a refill on your cardiac medications before your next appointment, please call your pharmacy.

## 2016-10-02 NOTE — Patient Instructions (Signed)
Continue on Coumadin as directed. Report  any signs of bleeding immediately. Avoid all nonsteroidals, Advil, Aleve, ibuprofen Follow-up for 2-D echo as planned Follow-up with cardiology as planned. Follow-up with Dr. Elsworth Soho in [facility] in 3 months and As needed

## 2016-10-02 NOTE — Assessment & Plan Note (Signed)
Pt had unprovoked bilateral PE Dec 2017. He had to stop Coumadin for GI bleeding and had recurrent PE March 2018

## 2016-10-02 NOTE — Progress Notes (Signed)
_0  ID: Francisco Rogers, male    DOB: 1941-06-04, 76 y.o.   MRN: [id number]  Chief Complaint  Patient presents with  . Hospitalization Follow-up    PE     Referring provider: No ref. provider found  HPI: 76 year old male seen for pulmonary consult during hospitalization in March 2018 for recurrent PE December 2017 diagnosed with unprovoked bilateral PE at [facility] , treated with Coumadin. Coumadin was stopped 08/31/2016 due to rectal bleeding. Admitted March 10 with bilateral PE. PMH Prostate Cancer 2015   TEST  09/08/2016 CT chest showed recurrent bilateral PE with CT evidence of right heart strain. 09/09/2016. 2-D echo EF 30-35%, pulmonary artery pressure 39 mmHg, 09/09/2016 venous Dopplers negative for DVT    10/08/2016 Follow up: PE Post hospital follow up  Pt returns for a post hospital follow up . Admitted in early March for PE .  He was dx with unprovoked bilateral PE at [facility] in Dec 2017 , started on Coumadin (could not afford NOAC) . He developed rectal bleeding 08/31/16 and coumadin was held. On 09/08/16 he was readmitted with recurrent bilateral PE.  CT chest showed bialteral PE w/ CT evidence of right heart strain. Ven doppler neg. Echo showed EF of 35%, PAP 55mHg. , Dilated RV. He was treated with Hep and Couamdin and discahrged on Coumadin .  09/28/16 INR 2.4 . Says he had CBC but unsure what his Hbg was. Will check when he gets home.  Since dischage he says he is doing better, w/ less dyspnea. No hemoptysis or known bleeding .  Pt edcuation on coumadin discussed.   Patient was found to have a dilated cardiomyopathy. He is following with cardiology. He has been recommended to have a Myoview in the near future. And echo in 3 months.   No Known Allergies   There is no immunization history on file for this patient.  Past Medical History:  Diagnosis Date  . Cancer ([place]   . Hypertension   . Pulmonary embolism (H[facility])     Tobacco History: History  Smoking  Status  . Never Smoker  Smokeless Tobacco  . Never Used   Counseling given: Not Answered   Outpatient Encounter Prescriptions as of 10/02/2016  Medication Sig  . acetaminophen (TYLENOL) 325 MG tablet Take 325-650 mg by mouth every 6 (six) hours as needed (for pain).  . carvedilol (COREG) 12.5 MG tablet Take 12.5 mg by mouth 2 (two) times daily with a meal.  . lisinopril (PRINIVIL,ZESTRIL) 10 MG tablet Take 1 tablet (10 mg total) by mouth daily.  . metoCLOPramide (REGLAN) 5 MG tablet Take 5 mg by mouth at bedtime.  . Multiple Vitamins-Minerals (ONE-A-DAY MENS 50+ ADVANTAGE) TABS Take 1 tablet by mouth daily.  .[place] GunningBOWEL PREP KIT 17.5-3.13-1.6 GM/180ML SOLN Take as directed for colonoscopy  . warfarin (COUMADIN) 5 MG tablet Take 7.514m(1.5 tablet) on 3/14 and 3/15 until follow up at coumadin clinic. Follow instructions on titrating coumadin dose  . enoxaparin (LOVENOX) 100 MG/ML injection Inject 0.95 mLs (95 mg total) into the skin every 12 (twelve) hours.  . [DISCONTINUED] lisinopril (PRINIVIL,ZESTRIL) 5 MG tablet Take 1 tablet (5 mg total) by mouth daily.   No facility-administered encounter medications on file as of 10/02/2016.      Review of Systems  Constitutional:   No  weight loss, night sweats,  Fevers, chills, fatigue, or  lassitude.  HEENT:   No headaches,  Difficulty swallowing,  Tooth/dental problems, or  Sore throat,  No sneezing, itching, ear ache, nasal congestion, post nasal drip,   CV:  No chest pain,  Orthopnea, PND, swelling in lower extremities, anasarca, dizziness, palpitations, syncope.   GI  No heartburn, indigestion, abdominal pain, nausea, vomiting, diarrhea, change in bowel habits, loss of appetite, bloody stools.   Resp:   No excess mucus, no productive cough,  No non-productive cough,  No coughing up of blood.  No change in color of mucus.  No wheezing.  No chest wall deformity  Skin: no rash or lesions.  GU: no dysuria, change in color  of urine, no urgency or frequency.  No flank pain, no hematuria   MS:  No joint pain or swelling.  No decreased range of motion.  No back pain.    Physical Exam  BP 130/86 (BP Location: Right Arm, Cuff Size: Normal)   Pulse 93   Ht _0  (1.803 m)   Wt 214 lb 12.8 oz (97.4 kg)   SpO2 98%   BMI 29.96 kg/m   GEN: A/Ox3; pleasant , NAD, elderly    HEENT:  [place]/AT,  EACs-clear, TMs-wnl, NOSE-clear, THROAT-clear, no lesions, no postnasal drip or exudate noted.   NECK:  Supple w/ fair ROM; no JVD; normal carotid impulses w/o bruits; no thyromegaly or nodules palpated; no lymphadenopathy.    RESP  Clear  P & A; w/o, wheezes/ rales/ or rhonchi. no accessory muscle use, no dullness to percussion  CARD:  RRR, no m/r/g, no peripheral edema, pulses intact, no cyanosis or clubbing.  GI:   Soft & nt; nml bowel sounds; no organomegaly or masses detected.   Musco: Warm bil, no deformities or joint swelling noted.   Neuro: alert, no focal deficits noted.    Skin: Warm, no lesions or rashes  Psych:  No change in mood or affect. No depression or anxiety.  No memory loss.  Lab Results:  CBC    Component Value Date/Time   WBC 5.7 09/12/2016 0350   RBC 3.83 (L) 09/12/2016 0350   HGB 7.9 (L) 09/12/2016 0350   HCT 25.6 (L) 09/12/2016 0350   PLT 262 09/12/2016 0350   MCV 66.8 (L) 09/12/2016 0350   MCH 20.6 (L) 09/12/2016 0350   MCHC 30.9 09/12/2016 0350   RDW 16.6 (H) 09/12/2016 0350   LYMPHSABS 1.6 09/08/2016 0755   MONOABS 1.0 09/08/2016 0755   EOSABS 0.2 09/08/2016 0755   BASOSABS 0.0 09/08/2016 0755    BMET    Component Value Date/Time   NA 136 09/12/2016 0350   K 3.6 09/12/2016 0350   CL 105 09/12/2016 0350   CO2 23 09/12/2016 0350   GLUCOSE 91 09/12/2016 0350   BUN 10 09/12/2016 0350   CREATININE 0.68 09/12/2016 0350   CALCIUM 9.4 09/12/2016 0350   GFRNONAA >60 09/12/2016 0350   GFRAA >60 09/12/2016 0350    BNP    Component Value Date/Time   BNP 319.9 (H)  09/08/2016 0755    ProBNP No results found for: PROBNP  Imaging: No results found.   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 Report  any signs of bleeding immediately. Avoid all nonsteroidals, Advil, Aleve, ibuprofen Follow-up for 2-D echo as planned Follow-up with cardiology as planned. Follow-up with Dr. 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

## 2016-10-02 NOTE — Assessment & Plan Note (Signed)
EF at [facility] in Dec 2017 was 45%. His EF was 30-35% with diffuse hypokinesis on echo performed 09/09/2016

## 2016-10-02 NOTE — Assessment & Plan Note (Signed)
Back on Coumadin tolerating this well. INR followed at [facility]

## 2016-10-08 DIAGNOSIS — D649 Anemia, unspecified: Secondary | ICD-10-CM | POA: Insufficient documentation

## 2016-10-08 NOTE — Assessment & Plan Note (Signed)
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 Report  any signs of bleeding immediately. Avoid all nonsteroidals, Advil, Aleve, ibuprofen Follow-up for 2-D echo as planned Follow-up with cardiology as planned. Follow-up with Dr. Elsworth Soho in [facility] in 3 months and As needed

## 2016-10-08 NOTE — Assessment & Plan Note (Signed)
Cont follow up with Cards .  

## 2016-10-08 NOTE — Assessment & Plan Note (Signed)
Will need follow up labs with PCP  Pt says he was just seen and labs done .  Keep follow up with PCP

## 2016-10-11 ENCOUNTER — Telehealth (HOSPITAL_COMMUNITY): Payer: Self-pay

## 2016-10-11 NOTE — Telephone Encounter (Signed)
Encounter complete. 

## 2016-10-16 ENCOUNTER — Ambulatory Visit (HOSPITAL_COMMUNITY)
Admission: RE | Admit: 2016-10-16 | Discharge: 2016-10-16 | Disposition: A | Discharge status: Home / Self Care | Payer: Medicare Other | Source: Ambulatory Visit | Attending: Cardiology | Admitting: Cardiology

## 2016-10-16 DIAGNOSIS — I429 Cardiomyopathy, unspecified: Secondary | ICD-10-CM

## 2016-10-16 DIAGNOSIS — I428 Other cardiomyopathies: Secondary | ICD-10-CM | POA: Insufficient documentation

## 2016-10-16 DIAGNOSIS — R9439 Abnormal result of other cardiovascular function study: Secondary | ICD-10-CM | POA: Insufficient documentation

## 2016-10-16 DIAGNOSIS — I10 Essential (primary) hypertension: Secondary | ICD-10-CM | POA: Insufficient documentation

## 2016-10-16 DIAGNOSIS — I2510 Atherosclerotic heart disease of native coronary artery without angina pectoris: Secondary | ICD-10-CM | POA: Diagnosis present

## 2016-10-16 DIAGNOSIS — Z8249 Family history of ischemic heart disease and other diseases of the circulatory system: Secondary | ICD-10-CM | POA: Diagnosis not present

## 2016-10-16 LAB — MYOCARDIAL PERFUSION IMAGING
LV dias vol: 138 mL (ref 62–150)
LV sys vol: 74 mL
Peak HR: 96 {beats}/min
Rest HR: 77 {beats}/min
SDS: 3
SRS: 2
SSS: 5
TID: 1.05

## 2016-10-16 MED ORDER — TECHNETIUM TC 99M TETROFOSMIN IV KIT
10.1000 | PACK | Freq: Once | INTRAVENOUS | Status: AC | PRN
  Administered 2016-10-16: 10.1 via INTRAVENOUS
  Filled 2016-10-16: qty 11

## 2016-10-16 MED ORDER — REGADENOSON 0.4 MG/5ML IV SOLN
0.4000 mg | Freq: Once | INTRAVENOUS | Status: AC
  Administered 2016-10-16: 0.4 mg via INTRAVENOUS

## 2016-10-16 MED ORDER — TECHNETIUM TC 99M TETROFOSMIN IV KIT
29.8000 | PACK | Freq: Once | INTRAVENOUS | Status: AC | PRN
  Administered 2016-10-16: 29.8 via INTRAVENOUS
  Filled 2016-10-16: qty 30

## 2016-10-23 ENCOUNTER — Ambulatory Visit: Payer: Federal, State, Local not specified - PPO | Admitting: Cardiovascular Disease

## 2017-01-03 ENCOUNTER — Other Ambulatory Visit: Payer: Self-pay

## 2017-01-03 ENCOUNTER — Ambulatory Visit (HOSPITAL_COMMUNITY): Payer: Medicare Other | Attending: Cardiology

## 2017-01-03 DIAGNOSIS — I420 Dilated cardiomyopathy: Secondary | ICD-10-CM | POA: Diagnosis not present

## 2017-01-04 ENCOUNTER — Ambulatory Visit (INDEPENDENT_AMBULATORY_CARE_PROVIDER_SITE_OTHER): Payer: Medicare Other | Admitting: Cardiovascular Disease

## 2017-01-04 ENCOUNTER — Encounter: Payer: Self-pay | Admitting: Cardiovascular Disease

## 2017-01-04 DIAGNOSIS — I420 Dilated cardiomyopathy: Secondary | ICD-10-CM | POA: Diagnosis not present

## 2017-01-04 DIAGNOSIS — I10 Essential (primary) hypertension: Secondary | ICD-10-CM | POA: Insufficient documentation

## 2017-01-04 DIAGNOSIS — I2699 Other pulmonary embolism without acute cor pulmonale: Secondary | ICD-10-CM | POA: Diagnosis not present

## 2017-01-04 NOTE — Assessment & Plan Note (Signed)
History of essential hypertension blood pressure measured today at 130/92. He is on carvedilol and losartan. Continue current meds at current dosing

## 2017-01-04 NOTE — Assessment & Plan Note (Signed)
History of cardiomyopathy of unclear etiology with recent echo performed yesterday revealed normalization of his ejection fraction now 55-60% compared to 30 at 35% on 09/09/16. The patient does feel clinically improved.

## 2017-01-04 NOTE — Patient Instructions (Signed)

## 2017-01-04 NOTE — Progress Notes (Signed)
01/04/2017 [facility] BISTLINE   1940/11/09  [id number]  Primary Physician Iva Lento, PA-C Primary Cardiologist: Lorretta Harp MD Renae Gloss  HPI:  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 He was referred to be established in our practice for ongoing cardiovascular care. He was seen by Kerin Ransom 10/02/16. He does have a history of treated hypertension. These have bilateral pulmonary emboli twice in the past during periods of interruption of Coumadin anticoagulation. His last echo performed 09/09/16 revealed an EF of 30-35% with him mildly dilated right ventricle and mildly reduced right ventricular systolic function. His 2-D echo performed yesterday revealed normalization of his LV function and his RV size and function as well.   Current Outpatient Prescriptions  Medication Sig Dispense Refill  . acetaminophen (TYLENOL) 325 MG tablet Take 325-650 mg by mouth every 6 (six) hours as needed (for pain).    . carvedilol (COREG) 12.5 MG tablet Take 12.5 mg by mouth 2 (two) times daily with a meal.    . losartan (COZAAR) 50 MG tablet Take 50 mg by mouth daily.    . metoCLOPramide (REGLAN) 5 MG tablet Take 5 mg by mouth at bedtime.    . Multiple Vitamins-Minerals (ONE-A-DAY MENS 50+ ADVANTAGE) TABS Take 1 tablet by mouth daily.    [place] G[place] BOWEL PREP KIT 17.5-3.13-1.6 GM/180ML SOLN Take as directed for colonoscopy  0  . warfarin (COUMADIN) 5 MG tablet Take 7.90m (1.5 tablet) on 3/14 and 3/15 until follow up at coumadin clinic. Follow instructions on titrating coumadin dose 30 tablet 0  . enoxaparin (LOVENOX) 100 MG/ML injection Inject 0.95 mLs (95 mg total) into the skin every 12 (twelve) hours. 10 mL 0   No current facility-administered medications for this visit.     No Known Allergies  Social History   Social History  . Marital status: Married    Spouse name:  N/A  . Number of children: N/A  . Years of education: N/A   Occupational History  . Not on file.   Social History Main Topics  . Smoking status: Never Smoker  . Smokeless tobacco: Never Used  . Alcohol use Yes     Comment: socially  . Drug use: No  . Sexual activity: Not on file   Other Topics Concern  . Not on file   Social History Narrative  . No narrative on file     Review of Systems: General: negative for chills, fever, night sweats or weight changes.  Cardiovascular: negative for chest pain, dyspnea on exertion, edema, orthopnea, palpitations, paroxysmal nocturnal dyspnea or shortness of breath Dermatological: negative for rash Respiratory: negative for cough or wheezing Urologic: negative for hematuria Abdominal: negative for nausea, vomiting, diarrhea, bright red blood per rectum, melena, or hematemesis Neurologic: negative for visual changes, syncope, or dizziness All other systems reviewed and are otherwise negative except as noted above.    Blood pressure (!) 138/92, pulse 85, height 5' 11"  (1.803 m), weight 214 lb (97.1 kg).  General appearance: alert and no distress Neck: no adenopathy, no carotid bruit, no JVD, supple, symmetrical, trachea midline and thyroid not enlarged, symmetric, no tenderness/mass/nodules Lungs: clear to auscultation bilaterally Heart: regular rate and rhythm, S1, S2 normal, no murmur, click, rub or gallop Extremities: extremities normal, atraumatic, no cyanosis or edema  EKG sinus bradycardia 58 with a nonspecific IVCD and septal Q waves  ASSESSMENT  AND PLAN:   Pulmonary embolism, bilateral ([place]) History of bilateral pulmonary emboli twice in the past currently on Coumadin anticoagulation. Should he require interruption for any reason he will need Lovenox bridging.  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 The patient does feel clinically improved.  Essential hypertension History of essential hypertension blood pressure measured today at 130/92. He is on carvedilol and losartan. Continue current meds at current dosing      Lorretta Harp MD [facility], [facility] 01/04/2017 1:41 PM

## 2017-01-04 NOTE — Assessment & Plan Note (Signed)
History of bilateral pulmonary emboli twice in the past currently on Coumadin anticoagulation. Should he require interruption for any reason he will need Lovenox bridging.

## 2017-01-10 ENCOUNTER — Ambulatory Visit: Payer: Federal, State, Local not specified - PPO | Admitting: Pulmonary Disease

## 2017-03-23 ENCOUNTER — Observation Stay (HOSPITAL_BASED_OUTPATIENT_CLINIC_OR_DEPARTMENT_OTHER)
Admission: EM | Admit: 2017-03-23 | Discharge: 2017-03-24 | Disposition: A | Discharge status: Home / Self Care | Payer: Medicare Other | Attending: Internal Medicine | Admitting: Internal Medicine

## 2017-03-23 ENCOUNTER — Encounter (HOSPITAL_BASED_OUTPATIENT_CLINIC_OR_DEPARTMENT_OTHER): Payer: Self-pay | Admitting: Adult Health

## 2017-03-23 DIAGNOSIS — Z8546 Personal history of malignant neoplasm of prostate: Secondary | ICD-10-CM | POA: Insufficient documentation

## 2017-03-23 DIAGNOSIS — D509 Iron deficiency anemia, unspecified: Principal | ICD-10-CM | POA: Insufficient documentation

## 2017-03-23 DIAGNOSIS — D649 Anemia, unspecified: Secondary | ICD-10-CM | POA: Diagnosis present

## 2017-03-23 DIAGNOSIS — Z8249 Family history of ischemic heart disease and other diseases of the circulatory system: Secondary | ICD-10-CM | POA: Insufficient documentation

## 2017-03-23 DIAGNOSIS — I2699 Other pulmonary embolism without acute cor pulmonale: Secondary | ICD-10-CM | POA: Diagnosis present

## 2017-03-23 DIAGNOSIS — Z923 Personal history of irradiation: Secondary | ICD-10-CM | POA: Diagnosis not present

## 2017-03-23 DIAGNOSIS — Z79899 Other long term (current) drug therapy: Secondary | ICD-10-CM | POA: Diagnosis not present

## 2017-03-23 DIAGNOSIS — Z7901 Long term (current) use of anticoagulants: Secondary | ICD-10-CM | POA: Diagnosis not present

## 2017-03-23 DIAGNOSIS — I420 Dilated cardiomyopathy: Secondary | ICD-10-CM | POA: Diagnosis not present

## 2017-03-23 DIAGNOSIS — K922 Gastrointestinal hemorrhage, unspecified: Secondary | ICD-10-CM

## 2017-03-23 DIAGNOSIS — I10 Essential (primary) hypertension: Secondary | ICD-10-CM | POA: Diagnosis not present

## 2017-03-23 DIAGNOSIS — Z86711 Personal history of pulmonary embolism: Secondary | ICD-10-CM | POA: Insufficient documentation

## 2017-03-23 HISTORY — DX: Anemia, unspecified: D64.9

## 2017-03-23 HISTORY — DX: Malignant neoplasm of prostate: C61

## 2017-03-23 LAB — CBC WITH DIFFERENTIAL/PLATELET
BASOS ABS: 0 10*3/uL (ref 0.0–0.1)
Basophils Relative: 0 %
Eosinophils Absolute: 0.1 10*3/uL (ref 0.0–0.7)
Eosinophils Relative: 1 %
HEMATOCRIT: 20.9 % — AB (ref 39.0–52.0)
Hemoglobin: 6.3 g/dL — CL (ref 13.0–17.0)
LYMPHS PCT: 23 %
Lymphs Abs: 1.4 10*3/uL (ref 0.7–4.0)
MCH: 18.8 pg — ABNORMAL LOW (ref 26.0–34.0)
MCHC: 30.1 g/dL (ref 30.0–36.0)
MCV: 62.4 fL — ABNORMAL LOW (ref 78.0–100.0)
MONOS PCT: 12 %
Monocytes Absolute: 0.7 10*3/uL (ref 0.1–1.0)
Neutro Abs: 4 10*3/uL (ref 1.7–7.7)
Neutrophils Relative %: 64 %
Platelets: 486 10*3/uL — ABNORMAL HIGH (ref 150–400)
RBC: 3.35 MIL/uL — AB (ref 4.22–5.81)
RDW: 24.2 % — ABNORMAL HIGH (ref 11.5–15.5)
WBC: 6.2 10*3/uL (ref 4.0–10.5)

## 2017-03-23 LAB — COMPREHENSIVE METABOLIC PANEL
ALBUMIN: 3.6 g/dL (ref 3.5–5.0)
ALT: 11 U/L — AB (ref 17–63)
AST: 21 U/L (ref 15–41)
Alkaline Phosphatase: 55 U/L (ref 38–126)
Anion gap: 4 — ABNORMAL LOW (ref 5–15)
BILIRUBIN TOTAL: 0.5 mg/dL (ref 0.3–1.2)
BUN: 13 mg/dL (ref 6–20)
CHLORIDE: 107 mmol/L (ref 101–111)
CO2: 24 mmol/L (ref 22–32)
CREATININE: 1.16 mg/dL (ref 0.61–1.24)
Calcium: 9.4 mg/dL (ref 8.9–10.3)
GFR calc Af Amer: >60 mL/min (ref >60)
GFR calc non Af Amer: 59 mL/min — ABNORMAL LOW (ref >60)
GLUCOSE: 97 mg/dL (ref 65–99)
POTASSIUM: 3.6 mmol/L (ref 3.5–5.1)
Sodium: 135 mmol/L (ref 135–145)
TOTAL PROTEIN: 6.5 g/dL (ref 6.5–8.1)

## 2017-03-23 LAB — OCCULT BLOOD X 1 CARD TO LAB, STOOL: Fecal Occult Bld: NEGATIVE

## 2017-03-23 LAB — PROTIME-INR
INR: 3.05
Prothrombin Time: 31.3 seconds — ABNORMAL HIGH (ref 11.4–15.2)

## 2017-03-23 LAB — APTT: APTT: 40 s — AB (ref 24–36)

## 2017-03-23 NOTE — ED Triage Notes (Signed)
Presents from home with call from PMD stating he needed to go to ER now due a abnormal lab HgB at 6.0, pt also endorses 3 episodes of bloody stools 2 sundays ago. He denies SOB, dizziness, weakness, fatigue or pain. HE is on coumadin and also a medication that is a "blood Builder called hemosis"

## 2017-03-23 NOTE — ED Provider Notes (Signed)
[facility] Provider Note   CSN: [id number] Arrival date & time: 03/23/17  1744     History   Chief Complaint Chief Complaint  Patient presents with  . Abnormal Lab    HPI Francisco Rogers is a 76 y.o. male.  HPI  Pt comes in with low Hb. Pt has hx of cancer, HTN, PE and he is on coumadin. Pt reports that over the last 2 weeks he has had about 5 episodes of bloody stools with clots in the stools. Pt's last bloody stool was 5 days ago. Pt always has dark stools because he is chronically anemic and he is on iron supplements. Pt has no chest pain, dib, palpitations, neat syncope.  Past Medical History:  Diagnosis Date  . Cancer ([place])   . Hypertension   . Pulmonary embolism [facility])     Patient Active Problem List   Diagnosis Date Noted  . Essential hypertension 01/04/2017  . Anemia 10/08/2016  . Chronic anticoagulation 10/02/2016  . 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 Acute respiratory failure with hypoxia ([place]) 09/10/2016  . Pulmonary embolism, bilateral ([place]) 09/08/2016  . Hypoxia   . Tachycardia     Past Surgical History:  Procedure Laterality Date  . HERNIA REPAIR         Home Medications    Prior to Admission medications   Medication Sig Start Date End Date Taking? Authorizing Provider  acetaminophen (TYLENOL) 325 MG tablet Take 325-650 mg by mouth every 6 (six) hours as needed (for pain).    [provider]  carvedilol (COREG) 12.5 MG tablet Take 12.5 mg by mouth 2 (two) times daily with a meal.    [provider]  enoxaparin (LOVENOX) 100 MG/ML injection Inject 0.95 mLs (95 mg total) into the skin every 12 (twelve) hours. 09/12/16 09/18/16  Dessa Phi Chahn-Yang, DO  losartan (COZAAR) 50 MG tablet Take 50 mg by mouth daily.    [provider]  metoCLOPramide (REGLAN) 5 MG tablet Take 5 mg by mouth at bedtime. 09/05/16   [provider]  Multiple  Vitamins-Minerals (ONE-A-DAY MENS 50+ ADVANTAGE) TABS Take 1 tablet by mouth daily.    [provider]  SUPREP BOWEL PREP KIT 17.5-3.13-1.6 GM/180ML SOLN Take as directed for colonoscopy 09/05/16   [provider]  warfarin (COUMADIN) 5 MG tablet Take 7.56m (1.5 tablet) on 3/14 and 3/15 until follow up at coumadin clinic. Follow instructions on titrating coumadin dose 09/12/16   CShon Millet DO    Family History Family History  Problem Relation Age of Onset  . Heart attack Mother   . Pneumonia Father   . Diabetes Sister   . Breast cancer Sister   . CAD Brother     Social History Social History  Substance Use Topics  . Smoking status: Never Smoker  . Smokeless tobacco: Never Used  . Alcohol use Yes     Comment: socially     Allergies   Patient has no known allergies.   Review of Systems Review of Systems  Constitutional: Negative for activity change.  Respiratory: Negative for shortness of breath.   Cardiovascular: Negative for chest pain.  Gastrointestinal: Negative for abdominal pain and vomiting.  Neurological: Negative for dizziness.  Hematological: Bruises/bleeds easily.  All other systems reviewed and are negative.    Physical Exam Updated Vital Signs BP (!) 145/88   Pulse 90   Temp 97.9 F (36.6 C) (Oral)  Resp 18   SpO2 100%   Physical Exam  Constitutional: He is oriented to person, place, and time. He appears well-developed.  HENT:  Head: Atraumatic.  Neck: Neck supple.  Cardiovascular: Normal rate.   Pulmonary/Chest: Effort normal.  Abdominal: Soft. There is no tenderness.  Neurological: He is alert and oriented to person, place, and time.  Skin: Skin is warm.  Nursing note and vitals reviewed.    ED Treatments / Results  Labs (all labs ordered are listed, but only abnormal results are displayed) Labs Reviewed  COMPREHENSIVE METABOLIC PANEL - Abnormal; Notable for the following:       Result Value   ALT 11  (*)    GFR calc non Af Amer 59 (*)    Anion gap 4 (*)    All other components within normal limits  CBC WITH DIFFERENTIAL/PLATELET - Abnormal; Notable for the following:    RBC 3.35 (*)    Hemoglobin 6.3 (*)    HCT 20.9 (*)    MCV 62.4 (*)    MCH 18.8 (*)    RDW 24.2 (*)    Platelets 486 (*)    All other components within normal limits  PROTIME-INR - Abnormal; Notable for the following:    Prothrombin Time 31.3 (*)    All other components within normal limits  APTT - Abnormal; Notable for the following:    aPTT 40 (*)    All other components within normal limits  OCCULT BLOOD X 1 CARD TO LAB, STOOL    EKG  EKG Interpretation None       Radiology No results found.  Procedures Procedures (including critical care time) CRITICAL CARE Performed by: Varney Biles   Total critical care time: 32 minutes  Critical care time was exclusive of separately billable procedures and treating other patients.  Critical care was necessary to treat or prevent imminent or life-threatening deterioration.  Critical care was time spent personally by me on the following activities: development of treatment plan with patient and/or surrogate as well as nursing, discussions with consultants, evaluation of patient's response to treatment, examination of patient, obtaining history from patient or surrogate, ordering and performing treatments and interventions, ordering and review of laboratory studies, ordering and review of radiographic studies, pulse oximetry and re-evaluation of patient's condition.   Medications Ordered in ED Medications - No data to display   Initial Impression / Assessment and Plan / ED Course  I have reviewed the triage vital signs and the nursing notes.  Pertinent labs & imaging results that were available during my care of the patient were reviewed by me and considered in my medical decision making (see chart for details).     Pt is noted to be severely anemic,  Hb is 6.2. He is on coumadin. No active bleeding it seems like, and in general patient has chronic anemia that seems to have gotten worse due to few episodes of lower GI bleed he had over the last 2 weeks. Pt is asymptomatic, but the anemia is severe and pt will likely need transfusion to prevent and morbidity like stroke or ACS.   Final Clinical Impressions(s) / ED Diagnoses   Final diagnoses:  Severe anemia  Lower GI bleeding    New Prescriptions New Prescriptions   No medications on file     Varney Biles, MD 03/23/17 2000

## 2017-03-24 ENCOUNTER — Encounter (HOSPITAL_COMMUNITY): Payer: Self-pay | Admitting: Oncology

## 2017-03-24 DIAGNOSIS — I2699 Other pulmonary embolism without acute cor pulmonale: Secondary | ICD-10-CM | POA: Diagnosis not present

## 2017-03-24 DIAGNOSIS — D649 Anemia, unspecified: Secondary | ICD-10-CM | POA: Diagnosis not present

## 2017-03-24 DIAGNOSIS — D509 Iron deficiency anemia, unspecified: Secondary | ICD-10-CM | POA: Diagnosis not present

## 2017-03-24 LAB — VITAMIN B12: VITAMIN B 12: 202 pg/mL (ref 180–914)

## 2017-03-24 LAB — IRON AND TIBC
IRON: 15 ug/dL — AB (ref 45–182)
Saturation Ratios: 5 % — ABNORMAL LOW (ref 17.9–39.5)
TIBC: 316 ug/dL (ref 250–450)
UIBC: 301 ug/dL

## 2017-03-24 LAB — SEDIMENTATION RATE: SED RATE: 10 mm/h (ref 0–16)

## 2017-03-24 LAB — PREPARE RBC (CROSSMATCH)

## 2017-03-24 LAB — HEMOGLOBIN AND HEMATOCRIT, BLOOD
HCT: 23.8 % — ABNORMAL LOW (ref 39.0–52.0)
HEMOGLOBIN: 7.6 g/dL — AB (ref 13.0–17.0)

## 2017-03-24 LAB — FERRITIN: FERRITIN: 8 ng/mL — AB (ref 24–336)

## 2017-03-24 LAB — ABO/RH: ABO/RH(D): O POS

## 2017-03-24 MED ORDER — ACETAMINOPHEN 650 MG RE SUPP: 650.0000 mg | Freq: Four times a day (QID) | RECTAL | Status: DC | PRN

## 2017-03-24 MED ORDER — ACETAMINOPHEN 325 MG PO TABS: 650.0000 mg | ORAL_TABLET | Freq: Four times a day (QID) | ORAL | Status: DC | PRN

## 2017-03-24 MED ORDER — CARVEDILOL 12.5 MG PO TABS
12.5000 mg | ORAL_TABLET | Freq: Two times a day (BID) | ORAL | Status: DC
  Administered 2017-03-24: 12.5 mg via ORAL
  Filled 2017-03-24: qty 1

## 2017-03-24 MED ORDER — ACETAMINOPHEN 325 MG PO TABS
650.0000 mg | ORAL_TABLET | Freq: Once | ORAL | Status: AC
  Administered 2017-03-24: 650 mg via ORAL
  Filled 2017-03-24: qty 2

## 2017-03-24 MED ORDER — SODIUM CHLORIDE 0.9% FLUSH: 3.0000 mL | Freq: Two times a day (BID) | INTRAVENOUS | Status: DC

## 2017-03-24 MED ORDER — SODIUM CHLORIDE 0.9 % IV SOLN: Freq: Once | INTRAVENOUS | Status: DC

## 2017-03-24 MED ORDER — LOSARTAN POTASSIUM 50 MG PO TABS
50.0000 mg | ORAL_TABLET | Freq: Every day | ORAL | Status: DC
  Administered 2017-03-24: 50 mg via ORAL
  Filled 2017-03-24: qty 1

## 2017-03-24 MED ORDER — INFLUENZA VAC SPLIT HIGH-DOSE 0.5 ML IM SUSY: 0.5000 mL | PREFILLED_SYRINGE | INTRAMUSCULAR | Status: DC

## 2017-03-24 MED ORDER — SODIUM CHLORIDE 0.9 % IV SOLN: 250.0000 mL | INTRAVENOUS | Status: DC | PRN

## 2017-03-24 MED ORDER — SODIUM CHLORIDE 0.9% FLUSH: 3.0000 mL | INTRAVENOUS | Status: DC | PRN

## 2017-03-24 MED ORDER — DIPHENHYDRAMINE HCL 25 MG PO CAPS
25.0000 mg | ORAL_CAPSULE | Freq: Once | ORAL | Status: AC
  Administered 2017-03-24: 25 mg via ORAL
  Filled 2017-03-24: qty 1

## 2017-03-24 NOTE — Discharge Summary (Signed)
Discharge Summary  Francisco Rogers:[id number] DOB: 24-Sep-1940  PCP: Iva Lento, PA-C  Admit date: 03/23/2017 Discharge date: 03/24/2017  Time spent: <56mins  Recommendations for Outpatient Follow-up:  1. F/u with PMD within a week  for hospital discharge follow up, repeat cbc/bmp at follow up, pmd to  refer patient to hematology for anemia 2. F/u with GI Dr Ferdinand Lango in one week   Discharge Diagnoses:  Active Hospital Problems   Diagnosis Date Noted  . Severe anemia 10/08/2016  . Anemia 03/24/2017  . 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    Discharge Condition: stable  Diet recommendation: heart healthy  Filed Weights   03/24/17 0010 03/24/17 0600  Weight: 96.2 kg (212 lb 1.3 oz) 95.5 kg (210 lb 8.6 oz)    History of present illness: (per admitting MD Dr Maudie Mercury) Francisco Rogers  is a 76 y.o. male, w prostate cancer, s/p radioactive seeds, recurrent PE, hypertension, apparently c/o Hgb low on 9/21 and was sent by PCP, to ER.    Pt had some clotted blood w bm about 2 weeks ago, and a few more small episodes, the last being this past Tuesday, now everything is normal. Blood clots were in toilet bowel and on toilet paper.   Pt denies abd pain, n/v, diarrhea, black stool.  Pt notes colonoscopy 2 years ago w [facility]rell Lark,  Had polyp, can't recall diverticulosis.   In ED, pt found to have Hgb 6.3,  Plt 486, INR 3.05  Pt will be admitted for anemia.    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 -FOBT today is negative, no acute bleed in the hospital. -He received 2prbc, repeat hgb 7.6 -He is discharged home and follow up with pmd and GI. -pmd to refer patient to hematology for chronic microcytic anemia, continue iron supplement for now. On peripheral smear, there is mention of schistocytes, tbili wnl, cr wnl, no thrombocytopnenia, pmd refer to hematology  Thrombocytosis:  plt 486 likely reactive due to anemia, pmd to follow up, consider refer to hematology.  H/o recurrent PE;  Continue coumadin  Close follow up with coumadin clinic F/u with PMD , per patient he has a colonoscopy 59yrs ago, no cancer. pmd to follow up cancer screening.   HTN; stable, continue home meds coreg/losartan.   Procedures:  prbc x2units  Consultations:  none  Discharge Exam: BP 139/84   Pulse 81   Temp 97.8 F (36.6 C) (Oral)   Resp 18   Ht 5\' 11"  (1.803 m)   Wt 95.5 kg (210 lb 8.6 oz)   SpO2 100%   BMI 29.36 kg/m   General: NAD, aaox3 Cardiovascular: RRR Respiratory: CATBL Ab: nontender, +bs Extremity: no edema  Discharge Instructions You were cared for by a hospitalist during your hospital stay. If you have any questions about your discharge medications or the care you received while you were in the hospital after  you are discharged, you can call the unit and asked to speak with the hospitalist on call if the hospitalist that took care of you is not available. Once you are discharged, your primary care physician will handle any further medical issues. Please note that NO REFILLS for any discharge medications will be authorized once you are discharged, as it is imperative that you return to your primary care physician (or establish a relationship with a primary care physician if you do not have one) for your aftercare needs so that they can reassess your need for medications and  monitor your lab values.  Discharge Instructions    Diet - low sodium heart healthy    Complete by:  As directed    Increase activity slowly    Complete by:  As directed      Allergies as of 03/24/2017   No Known Allergies     Medication List    STOP taking these medications   enoxaparin 100 MG/ML injection Commonly known as:  LOVENOX     TAKE these medications   carvedilol 12.5 MG tablet Commonly known as:  COREG Take 12.5 mg by mouth 2 (two) times daily with a meal.   HEMOCYTE PLUS 106-1 MG Caps Take 1 capsule by mouth daily after breakfast.   losartan 100 MG tablet Commonly known as:  COZAAR Take 100 mg by mouth daily after breakfast.   nystatin-triamcinolone cream Commonly known as:  MYCOLOG II Apply 1 application topically 2 (two) times daily as needed (rash).   warfarin 7.5 MG tablet Commonly known as:  COUMADIN Take 3.75-7.5 mg by mouth daily. Take 3.75 mg on  Friday, Sunday, Tuesday, Thursday Take 7.5 mg on Saturday, Monday, Wednesday What changed:  Another medication with the same name was removed. Continue taking this medication, and follow the directions you see here.            Discharge Care Instructions        Start     Ordered   03/24/17 0000  Increase activity slowly     03/24/17 1212   03/24/17 0000  Diet - low sodium heart healthy     09 /23/18 1212     No Known Allergies Follow-up Information    Iva Lento, PA-C Follow up on 03/25/2017.   Specialty:  Internal Medicine Why:  hospital discharge follow up, repeat cbc/bmp Contact information: [place] [id number] [phone]        Coral Spikes, MD Follow up in 1 week(s).   Specialty:  Unknown Physician Specialty Why:  blood in stool  Contact information: [place] [phone]            The results of significant diagnostics from this hospitalization (including imaging, microbiology, ancillary and laboratory) are  listed below for reference.    Significant Diagnostic Studies: No results found.  Microbiology: No results found for this or any previous visit (from the past 240 hour(s)).   Labs: Basic Metabolic Panel:  Recent Labs Lab 03/23/17 1859  NA 135  K 3.6  CL 107  CO2 24  GLUCOSE 97  BUN 13  CREATININE 1.16  CALCIUM 9.4   Liver Function Tests:  Recent Labs Lab 03/23/17 1859  AST 21  ALT 11*  ALKPHOS 55  BILITOT 0.5  PROT 6.5  ALBUMIN 3.6   No results for input(s): LIPASE, AMYLASE in the last 168 hours. No results for input(s): AMMONIA in the last 168 hours.  CBC:  Recent Labs Lab 03/23/17 1859 03/24/17 1128  WBC 6.2  --   NEUTROABS 4.0  --   HGB 6.3* 7.6*  HCT 20.9* 23.8*  MCV 62.4*  --   PLT 486*  --    Cardiac Enzymes: No results for input(s): CKTOTAL, CKMB, CKMBINDEX, TROPONINI in the last 168 hours. BNP: BNP (last 3 results)  Recent Labs  09/08/16 0755  BNP 319.9*    ProBNP (last 3 results) No results for input(s): PROBNP in the last 8760 hours.  CBG: No results for input(s): GLUCAP in the last 168 hours.     SignedFlorencia Reasons MD, PhD  [facility] 03/24/2017, 12:41 PM

## 2017-03-24 NOTE — H&P (Signed)
TRH H&P   Patient Demographics:    Francisco Rogers, is a 76 y.o. male  MRN: [id number]   DOB - [birth year]  Admit Date - 03/23/2017  Outpatient Primary MD for the patient is Iva Lento, PA-C  Referring MD/NP/PA:  Kathrynn Humble  Outpatient Specialists:  [facility]rell [facility] (Gastroenterology) [place] (urology)  Patient coming from: home  Chief Complaint  Patient presents with  . Abnormal Lab      HPI:    Francisco Rogers  is a 76 y.o. male, w prostate cancer, s/p radioactive seeds, recurrent PE, hypertension, apparently c/o Hgb low on 9/21 and was sent by PCP, to ER.    Pt had some clotted blood w bm about 2 weeks ago, and a few more small episodes, the last being this past Tuesday, now everything is normal. Blood clots were in toilet bowel and on toilet paper.   Pt denies abd pain, n/v, diarrhea, black stool.  Pt notes colonoscopy 2 years ago w [facility] Lark,  Had polyp, can't recall diverticulosis.   In ED, pt found to have Hgb 6.3,  Plt 486, INR 3.05  Pt will be admitted for anemia.      Review of systems:    In addition to the HPI above, No Fever-chills, No Headache, No changes with Vision or hearing, No problems swallowing food or Liquids, No Chest pain, Cough or Shortness of Breath, No Abdominal pain, No Nausea or Vommitting,  No Blood in Urine, No dysuria, No new skin rashes or bruises, No new joints pains-aches,  No new weakness, tingling, numbness in any extremity, No recent weight gain or loss, No polyuria, polydypsia or polyphagia, No significant Mental Stressors.  A full 10 point Review of Systems was done, except as stated above, all other Review of Systems were negative.   With Past History of the following :    Past Medical History:  Diagnosis Date  . Anemia   . Cancer ([place])   . Hypertension   . Prostate cancer ([place])   . Pulmonary embolism [facility])        Past Surgical History:  Procedure Laterality Date  . COLONOSCOPY    . HERNIA REPAIR        Social History:     Social History  Substance Use Topics  . Smoking status: Never Smoker  . Smokeless tobacco: Never Used  . Alcohol use Yes     Comment: socially     Lives - at home  Mobility - walks by self   Family History :     Family History  Problem Relation Age of Onset  . Heart attack Mother   . Pneumonia Father   . Diabetes Sister   . Breast cancer Sister   . CAD Brother       Home Medications:   Prior to Admission medications   Medication Sig Start Date End Date Taking? Authorizing  Provider  acetaminophen (TYLENOL) 325 MG tablet Take 325-650 mg by mouth every 6 (six) hours as needed (for pain).    [provider]  carvedilol (COREG) 12.5 MG tablet Take 12.5 mg by mouth 2 (two) times daily with a meal.    [provider]  enoxaparin (LOVENOX) 100 MG/ML injection Inject 0.95 mLs (95 mg total) into the skin every 12 (twelve) hours. 09/12/16 09/18/16  Dessa Phi Chahn-Yang, DO  losartan (COZAAR) 50 MG tablet Take 50 mg by mouth daily.    [provider]  metoCLOPramide (REGLAN) 5 MG tablet Take 5 mg by mouth at bedtime. 09/05/16   [provider]  Multiple Vitamins-Minerals (ONE-A-DAY MENS 50+ ADVANTAGE) TABS Take 1 tablet by mouth daily.    [provider]  SUPREP BOWEL PREP KIT 17.5-3.13-1.6 GM/180ML SOLN Take as directed for colonoscopy 09/05/16   [provider]  warfarin (COUMADIN) 5 MG tablet Take 7.41m (1.5 tablet) on 3/14 and 3/15 until follow up at coumadin clinic. Follow instructions on titrating coumadin dose 09/12/16   CDessa PhiChahn-Yang, DO     Allergies:    No Known Allergies   Physical Exam:   Vitals  Blood pressure (!) 148/80, pulse 89, temperature 98.4 F (36.9 C), temperature source Oral, resp. rate 17, SpO2 99 %.   1. General  lying in bed in NAD,    2. Normal affect and insight,  Not Suicidal or Homicidal, Awake Alert, Oriented X 3.  3. No F.N deficits, ALL C.Nerves Intact, Strength 5/5 all 4 extremities, Sensation intact all 4 extremities, Plantars down going.  4. Ears and Eyes appear Normal, Conjunctivae clear, PERRLA. Moist Oral Mucosa.  5. Supple Neck, No JVD, No cervical lymphadenopathy appriciated, No Carotid Bruits.  6. Symmetrical Chest wall movement, Good air movement bilaterally, CTAB.  7. RRR, No Gallops, Rubs or Murmurs, No Parasternal Heave.  8. Positive Bowel Sounds, Abdomen Soft, No tenderness, No organomegaly appriciated,No rebound -guarding or rigidity.  9.  No Cyanosis, Normal Skin Turgor, No Skin Rash or Bruise.  10. Good muscle tone,  joints appear normal , no effusions, Normal ROM.  11. No Palpable Lymph Nodes in Neck or Axillae     Data Review:    CBC  Recent Labs Lab 03/23/17 1859  WBC 6.2  HGB 6.3*  HCT 20.9*  PLT 486*  MCV 62.4*  MCH 18.8*  MCHC 30.1  RDW 24.2*  LYMPHSABS 1.4  MONOABS 0.7  EOSABS 0.1  BASOSABS 0.0   ------------------------------------------------------------------------------------------------------------------  Chemistries   Recent Labs Lab 03/23/17 1859  NA 135  K 3.6  CL 107  CO2 24  GLUCOSE 97  BUN 13  CREATININE 1.16  CALCIUM 9.4  AST 21  ALT 11*  ALKPHOS 55  BILITOT 0.5   ------------------------------------------------------------------------------------------------------------------ CrCl cannot be calculated (Unknown ideal weight.). ------------------------------------------------------------------------------------------------------------------ No results for input(s): TSH, T4TOTAL, T3FREE, THYROIDAB in the last 72 hours.  Invalid input(s): FREET3  Coagulation profile  Recent Labs Lab 03/23/17 1859  INR 3.05   ------------------------------------------------------------------------------------------------------------------- No results for input(s): DDIMER in the  last 72 hours. -------------------------------------------------------------------------------------------------------------------  Cardiac Enzymes No results for input(s): CKMB, TROPONINI, MYOGLOBIN in the last 168 hours.  Invalid input(s): CK ------------------------------------------------------------------------------------------------------------------    Component Value Date/Time   BNP 319.9 (H) 09/08/2016 0755     ---------------------------------------------------------------------------------------------------------------  Urinalysis No results found for: COLORURINE, APPEARANCEUR, LABSPEC, PHURINE, GLUCOSEU, HGBUR, BILIRUBINUR, KETONESUR, PROTEINUR, UROBILINOGEN, NITRITE, LEUKOCYTESUR  ----------------------------------------------------------------------------------------------------------------   Imaging Results:    No results found.  Assessment & Plan:    Principal Problem:   Anemia Active Problems:   Dilated cardiomyopathy- etiology not yet determined   Recurrent pulmonary embolism (HCC)    Anemia Check ferritin, iron, tibc, folate, b12, esr, spep, upep FOBT Consider GI consult in am NPO after mn Transfuse 2 units prbc  Recurrent PE Consider genetic testing Most likely due to prostate cancer (risk factor) Coumadin pharmacy to dose  Hypertension Cont losartan, cont carvedilol  Thrombocytosis ? Iron def Check cbc in am      DVT Prophylaxis coumadin  AM Labs Ordered, also please review Full Orders  Family Communication: Admission, patients condition and plan of care including tests being ordered have been discussed with the patient who indicate understanding and agree with the plan and Code Status.  Code Status FULL CODE  Likely DC to  home  Condition GUARDED    Consults called: none  Admission status: observation  Time spent in minutes : 45 minutes   Jani Gravel M.D on 03/24/2017 at 12:32 AM  Between 7am to 7pm - Pager -  [phone]. After 7pm go to [email or web address] - password [facility]  [facility] - Office  [phone]

## 2017-03-25 LAB — FOLATE RBC
Folate, Hemolysate: 316.6 ng/mL
Folate, RBC: 1377 ng/mL (ref >498)
HEMATOCRIT: 23 % — AB (ref 37.5–51.0)

## 2017-03-25 LAB — TYPE AND SCREEN
ABO/RH(D): O POS
ANTIBODY SCREEN: NEGATIVE
UNIT DIVISION: 0
Unit division: 0

## 2017-03-25 LAB — BPAM RBC
Blood Product Expiration Date: 201810162359
Blood Product Expiration Date: 201810162359
ISSUE DATE / TIME: 201809230552
ISSUE DATE / TIME: 201809230252
Unit Type and Rh: 5100
Unit Type and Rh: 5100

## 2017-03-26 LAB — PROTEIN ELECTROPHORESIS, SERUM
A/G Ratio: 1 (ref 0.7–1.7)
ALPHA-1-GLOBULIN: 0.3 g/dL (ref 0.0–0.4)
Albumin ELP: 3.1 g/dL (ref 2.9–4.4)
Alpha-2-Globulin: 0.7 g/dL (ref 0.4–1.0)
Beta Globulin: 0.9 g/dL (ref 0.7–1.3)
GAMMA GLOBULIN: 1.3 g/dL (ref 0.4–1.8)
GLOBULIN, TOTAL: 3.2 g/dL (ref 2.2–3.9)
TOTAL PROTEIN ELP: 6.3 g/dL (ref 6.0–8.5)

## 2018-12-02 IMAGING — DX DG CHEST 1V PORT
1 series · 1 of 1 positions shown · non-contrast
Comparison: 06/19/2016

CLINICAL DATA: Shortness of Breath

EXAM:
PORTABLE CHEST 1 VIEW

[chest ap]
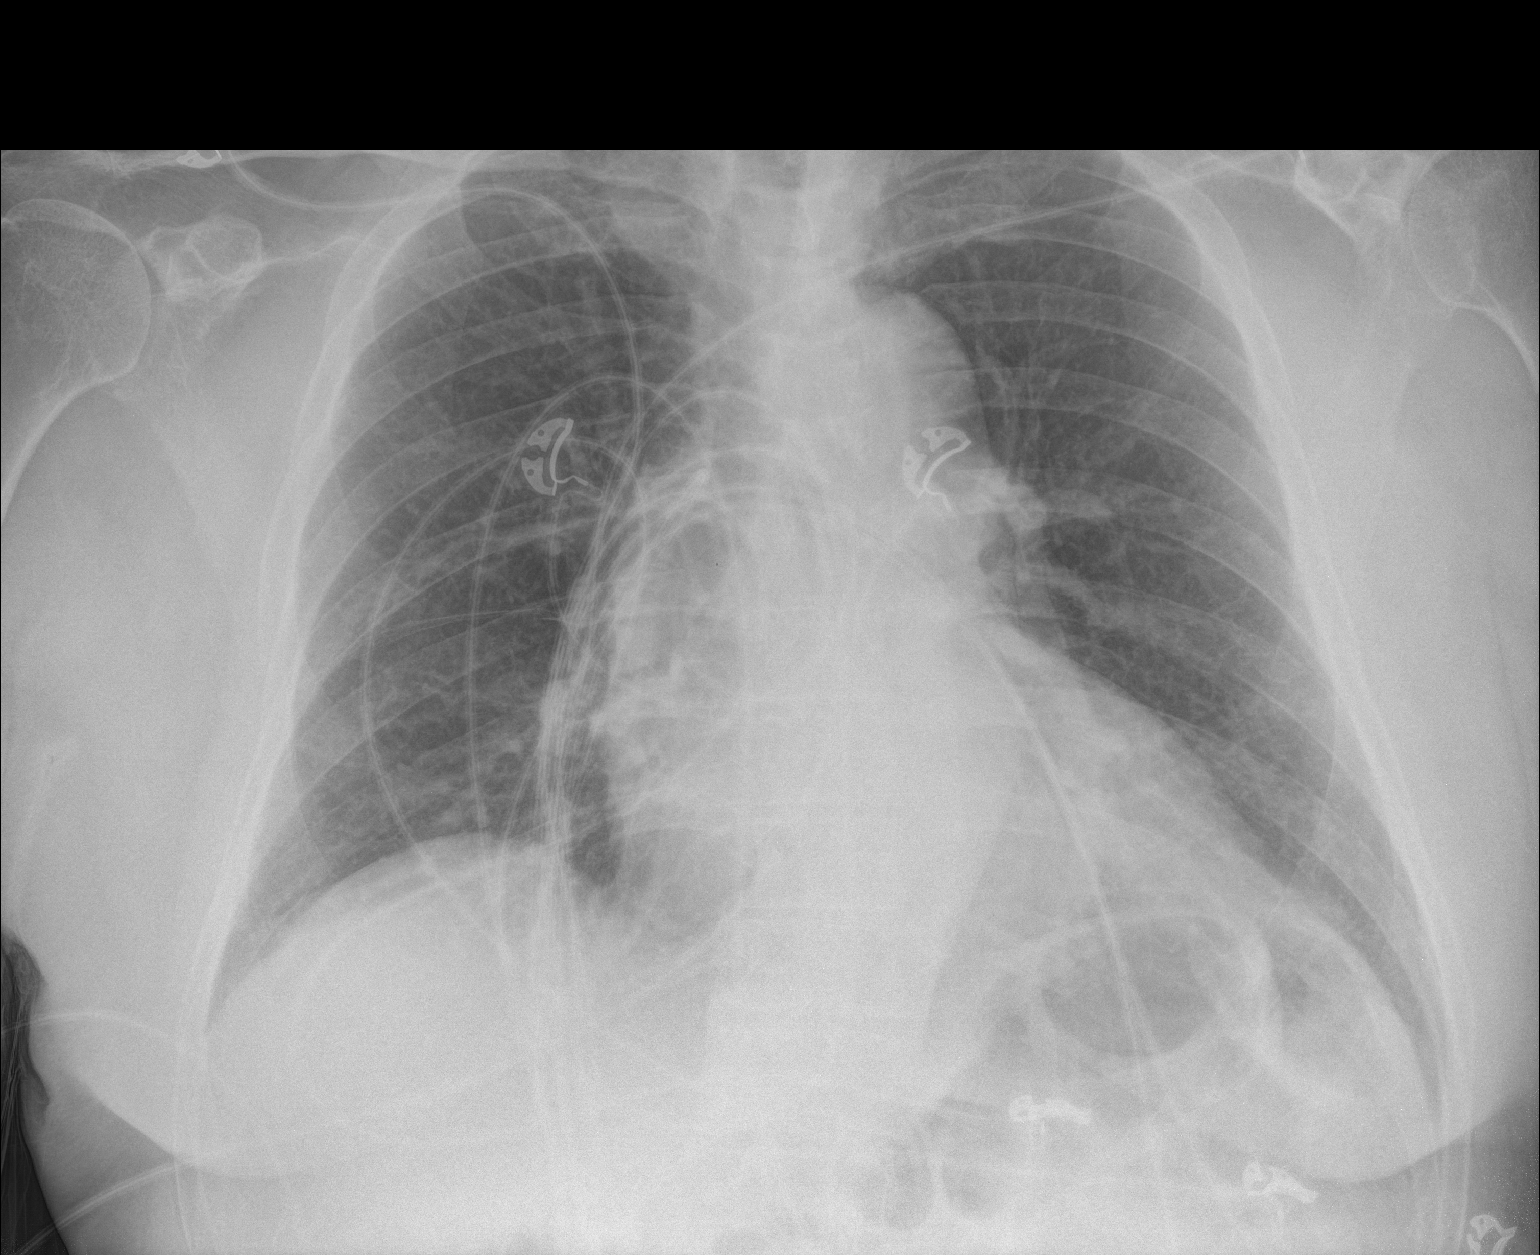

[1 of 1 positions shown; findings below may reference images not displayed]

FINDINGS: The heart size and mediastinal contours are within normal limits.
Both lungs are clear. The visualized skeletal structures are
unremarkable.
IMPRESSION: No active disease.

## 2022-08-08 ENCOUNTER — Emergency Department (HOSPITAL_BASED_OUTPATIENT_CLINIC_OR_DEPARTMENT_OTHER): Payer: Medicare HMO

## 2022-08-08 ENCOUNTER — Emergency Department (HOSPITAL_BASED_OUTPATIENT_CLINIC_OR_DEPARTMENT_OTHER)
Admission: EM | Admit: 2022-08-08 | Discharge: 2022-08-08 | Disposition: A | Discharge status: Home / Self Care | Payer: Medicare HMO | Attending: Emergency Medicine | Admitting: Emergency Medicine

## 2022-08-08 ENCOUNTER — Encounter (HOSPITAL_BASED_OUTPATIENT_CLINIC_OR_DEPARTMENT_OTHER): Payer: Self-pay | Admitting: Pediatrics

## 2022-08-08 ENCOUNTER — Other Ambulatory Visit: Payer: Self-pay

## 2022-08-08 DIAGNOSIS — S3992XA Unspecified injury of lower back, initial encounter: Secondary | ICD-10-CM | POA: Diagnosis present

## 2022-08-08 DIAGNOSIS — R410 Disorientation, unspecified: Secondary | ICD-10-CM

## 2022-08-08 DIAGNOSIS — Z79899 Other long term (current) drug therapy: Secondary | ICD-10-CM | POA: Insufficient documentation

## 2022-08-08 DIAGNOSIS — Z7901 Long term (current) use of anticoagulants: Secondary | ICD-10-CM | POA: Insufficient documentation

## 2022-08-08 DIAGNOSIS — W19XXXA Unspecified fall, initial encounter: Secondary | ICD-10-CM | POA: Insufficient documentation

## 2022-08-08 DIAGNOSIS — N390 Urinary tract infection, site not specified: Secondary | ICD-10-CM

## 2022-08-08 DIAGNOSIS — S32021A Stable burst fracture of second lumbar vertebra, initial encounter for closed fracture: Secondary | ICD-10-CM | POA: Diagnosis not present

## 2022-08-08 LAB — COMPREHENSIVE METABOLIC PANEL
ALT: 13 U/L (ref 0–44)
AST: 24 U/L (ref 15–41)
Albumin: 3.6 g/dL (ref 3.5–5.0)
Alkaline Phosphatase: 83 U/L (ref 38–126)
Anion gap: 10 (ref 5–15)
BUN: 16 mg/dL (ref 8–23)
CO2: 23 mmol/L (ref 22–32)
Calcium: 10.3 mg/dL (ref 8.9–10.3)
Chloride: 101 mmol/L (ref 98–111)
Creatinine, Ser: 1.26 mg/dL — ABNORMAL HIGH (ref 0.61–1.24)
GFR, Estimated: 57 mL/min — ABNORMAL LOW (ref >60)
Glucose, Bld: 142 mg/dL — ABNORMAL HIGH (ref 70–99)
Potassium: 3.4 mmol/L — ABNORMAL LOW (ref 3.5–5.1)
Sodium: 134 mmol/L — ABNORMAL LOW (ref 135–145)
Total Bilirubin: 1.2 mg/dL (ref 0.3–1.2)
Total Protein: 7.3 g/dL (ref 6.5–8.1)

## 2022-08-08 LAB — PROTIME-INR
INR: 1.2 (ref 0.8–1.2)
Prothrombin Time: 15.2 seconds (ref 11.4–15.2)

## 2022-08-08 LAB — URINALYSIS, MICROSCOPIC (REFLEX): WBC, UA: >50 WBC/hpf (ref 0–5)

## 2022-08-08 LAB — URINALYSIS, ROUTINE W REFLEX MICROSCOPIC
Glucose, UA: NEGATIVE mg/dL
Ketones, ur: NEGATIVE mg/dL
Leukocytes,Ua: NEGATIVE
Nitrite: NEGATIVE
Protein, ur: 100 mg/dL — AB
Specific Gravity, Urine: 1.015 (ref 1.005–1.030)
pH: 5.5 (ref 5.0–8.0)

## 2022-08-08 LAB — CBC
HCT: 40.6 % (ref 39.0–52.0)
Hemoglobin: 12.7 g/dL — ABNORMAL LOW (ref 13.0–17.0)
MCH: 21.9 pg — ABNORMAL LOW (ref 26.0–34.0)
MCHC: 31.3 g/dL (ref 30.0–36.0)
MCV: 69.9 fL — ABNORMAL LOW (ref 80.0–100.0)
Platelets: 240 10*3/uL (ref 150–400)
RBC: 5.81 MIL/uL (ref 4.22–5.81)
RDW: 16.5 % — ABNORMAL HIGH (ref 11.5–15.5)
WBC: 6 10*3/uL (ref 4.0–10.5)
nRBC: 0 % (ref 0.0–0.2)

## 2022-08-08 LAB — CBG MONITORING, ED: Glucose-Capillary: 136 mg/dL — ABNORMAL HIGH (ref 70–99)

## 2022-08-08 MED ORDER — SODIUM CHLORIDE 0.9 % IV BOLUS
1000.0000 mL | Freq: Once | INTRAVENOUS | Status: AC
  Administered 2022-08-08: 1000 mL via INTRAVENOUS

## 2022-08-08 MED ORDER — LIDOCAINE 5 % EX PTCH
1.0000 | MEDICATED_PATCH | CUTANEOUS | Status: DC
  Administered 2022-08-08: 1 via TRANSDERMAL
  Filled 2022-08-08: qty 1

## 2022-08-08 MED ORDER — SODIUM CHLORIDE 0.9 % IV SOLN
1.0000 g | Freq: Once | INTRAVENOUS | Status: AC
  Administered 2022-08-08: 1 g via INTRAVENOUS
  Filled 2022-08-08: qty 10

## 2022-08-08 MED ORDER — CEPHALEXIN 500 MG PO CAPS: 500.0000 mg | ORAL_CAPSULE | Freq: Two times a day (BID) | ORAL | 0 refills | Status: AC

## 2022-08-08 MED ORDER — LIDOCAINE 5 % EX PTCH: 1.0000 | MEDICATED_PATCH | CUTANEOUS | 0 refills | Status: AC

## 2022-08-08 NOTE — ED Notes (Signed)
[facility] arrived to apply back brace

## 2022-08-08 NOTE — ED Notes (Signed)
[facility] for Back Brace at 5:27

## 2022-08-08 NOTE — ED Notes (Signed)
Spoke with XR in regards to clarification about Lumbar XR 2-3 view. Pt to be XR with brace on and preferably standing to ensure proper function of brace.

## 2022-08-08 NOTE — ED Triage Notes (Signed)
Daughter at bedside reports patient had a fall greater than 3 weeks ago and was evaluated by PCP last week. Was prescribed some Tramadol for pain. Daughter concerns for alteration in mentation, decreased appetite, lower extremity weakness as well as sensation on the right side of his foot that comes and goes. Reports took Tramadol 50 mg right before arrival to ED.

## 2022-08-08 NOTE — ED Provider Notes (Signed)
[facility] Provider Note   CSN: [id number] Arrival date & time: 08/08/22  1357     History {Add pertinent medical, surgical, social history, OB history to HPI:1} Chief Complaint  Patient presents with   Numbness   Altered Mental Status   Extremity Weakness    Francisco Rogers is a 82 y.o. male.  He is brought in by family member for evaluation of altered mental status.  Sounds like about 3 weeks ago he had a fall in which he landed on his buttocks.  He has been having pain in his low back since then.  He saw a doctor about a week ago and was put on tramadol after x-rays.  Since then he has had episodes of confusion.  Gets mixed up with conversations.  She also feels he has less strength in his legs and more unsteady with ambulation.  He has complained of more numbness in his feet than usual.  He denies any chest pain shortness of breath headache abdominal pain.  Eating and drinking well.  The history is provided by the patient and a relative.  Altered Mental Status Presenting symptoms: confusion   Episode history:  Multiple Timing:  Intermittent Progression:  Unchanged Chronicity:  New Context: recent change in medication   Associated symptoms: weakness   Associated symptoms: no abdominal pain, no difficulty breathing, no fever, no headaches, no nausea, no rash, no slurred speech and no vomiting   Extremity Weakness Pertinent negatives include no chest pain, no abdominal pain, no headaches and no shortness of breath.       Home Medications Prior to Admission medications   Medication Sig Start Date End Date Taking? Authorizing Provider  carvedilol (COREG) 12.5 MG tablet Take 12.5 mg by mouth 2 (two) times daily with a meal.    [provider]  Fe Fum-FA-B Cmp-C-Zn-Mg-Mn-Cu (HEMOCYTE PLUS) 106-1 MG CAPS Take 1 capsule by mouth daily after breakfast. 02/28/17   [provider]  losartan (COZAAR) 100 MG tablet Take 100 mg by  mouth daily after breakfast. 02/22/17   [provider]  nystatin-triamcinolone (MYCOLOG II) cream Apply 1 application topically 2 (two) times daily as needed (rash).  02/04/17   [provider]  warfarin (COUMADIN) 7.5 MG tablet Take 3.75-7.5 mg by mouth daily. Take 3.75 mg on  Friday, Sunday, Tuesday, Thursday Take 7.5 mg on Saturday, Monday, Wednesday 02/14/17   [provider]      Allergies    Patient has no known allergies.    Review of Systems   Review of Systems  Constitutional:  Negative for fever.  HENT:  Negative for sore throat.   Respiratory:  Negative for shortness of breath.   Cardiovascular:  Negative for chest pain.  Gastrointestinal:  Negative for abdominal pain, nausea and vomiting.  Genitourinary:  Negative for dysuria.  Musculoskeletal:  Positive for back pain and extremity weakness.  Skin:  Negative for rash.  Neurological:  Positive for weakness. Negative for headaches.  Psychiatric/Behavioral:  Positive for confusion.     Physical Exam Updated Vital Signs BP (!) 126/95 (BP Location: Right Arm)   Pulse (!) 117   Temp 98.3 F (36.8 C) (Oral)   Resp 18   Ht '5\' 10"'$  (1.778 m)   Wt 88.9 kg   SpO2 96%   BMI 28.12 kg/m  Physical Exam Vitals and nursing note reviewed.  Constitutional:      General: He is not in acute distress.  Appearance: Normal appearance. He is well-developed.  HENT:     Head: Normocephalic and atraumatic.  Eyes:     Conjunctiva/sclera: Conjunctivae normal.  Cardiovascular:     Rate and Rhythm: Regular rhythm. Tachycardia present.     Heart sounds: No murmur heard. Pulmonary:     Effort: Pulmonary effort is normal. No respiratory distress.     Breath sounds: Normal breath sounds.  Abdominal:     Palpations: Abdomen is soft.     Tenderness: There is no abdominal tenderness. There is no guarding or rebound.  Musculoskeletal:        General: No swelling.     Cervical back: Neck supple.  Skin:    General:  Skin is warm and dry.     Capillary Refill: Capillary refill takes less than 2 seconds.  Neurological:     General: No focal deficit present.     Mental Status: He is alert.     Cranial Nerves: No cranial nerve deficit.     Sensory: No sensory deficit.     Motor: No weakness.     ED Results / Procedures / Treatments   Labs (all labs ordered are listed, but only abnormal results are displayed) Labs Reviewed  COMPREHENSIVE METABOLIC PANEL - Abnormal; Notable for the following components:      Result Value   Sodium 134 (*)    Potassium 3.4 (*)    Glucose, Bld 142 (*)    Creatinine, Ser 1.26 (*)    GFR, Estimated 57 (*)    All other components within normal limits  CBC - Abnormal; Notable for the following components:   Hemoglobin 12.7 (*)    MCV 69.9 (*)    MCH 21.9 (*)    RDW 16.5 (*)    All other components within normal limits  CBG MONITORING, ED - Abnormal; Notable for the following components:   Glucose-Capillary 136 (*)    All other components within normal limits  URINALYSIS, ROUTINE W REFLEX MICROSCOPIC  PROTIME-INR    EKG EKG Interpretation  Date/Time:  Wednesday August 08 2022 14:28:19 EST Ventricular Rate:  113 PR Interval:  167 QRS Duration: 85 QT Interval:  311 QTC Calculation: 427 R Axis:   15 Text Interpretation: Sinus tachycardia Borderline T wave abnormalities No significant change since prior 3/18 Confirmed by Aletta Edouard [phone]) on 08/08/2022 2:59:50 PM  Radiology No results found.  Procedures Procedures  {Document cardiac monitor, telemetry assessment procedure when appropriate:1}  Medications Ordered in ED Medications  sodium chloride 0.9 % bolus 1,000 mL (has no administration in time range)    ED Course/ Medical Decision Making/ A&P   {   Click here for ABCD2, HEART and other calculatorsREFRESH Note before signing :1}                          Medical Decision Making Amount and/or Complexity of Data Reviewed Labs:  ordered. Radiology: ordered.   This patient complains of ***; this involves an extensive number of treatment Options and is a complaint that carries with it a high risk of complications and morbidity. The differential includes ***  I ordered, reviewed and interpreted labs, which included *** I ordered medication *** and reviewed PMP when indicated. I ordered imaging studies which included *** and I independently    visualized and interpreted imaging which showed *** Additional history obtained from *** Previous records obtained and reviewed *** I consulted *** and discussed lab and imaging findings  and discussed disposition.  Cardiac monitoring reviewed, *** Social determinants considered, *** Critical Interventions: ***  After the interventions stated above, I reevaluated the patient and found *** Admission and further testing considered, ***   {Document critical care time when appropriate:1} {Document review of labs and clinical decision tools ie heart score, Chads2Vasc2 etc:1}  {Document your independent review of radiology images, and any outside records:1} {Document your discussion with family members, caretakers, and with consultants:1} {Document social determinants of health affecting pt's care:1} {Document your decision making why or why not admission, treatments were needed:1} Final Clinical Impression(s) / ED Diagnoses Final diagnoses:  None    Rx / DC Orders ED Discharge Orders     None

## 2022-08-08 NOTE — ED Notes (Signed)
Patient transported to CT 

## 2022-08-08 NOTE — Discharge Instructions (Addendum)
You are seen in the emergency department for continued back pain along with new confusion.  Your urine showed positive signs of infection.  CAT scan of your back showed a burst fracture of lumbar vertebra 2.  Neurosurgery Dr. Saintclair Halsted is recommending a brace and following up in his office.  We are prescribing some Lidoderm patches to use to the area.  The tramadol may be causing your confusion and it may be worthwhile to just use Tylenol for pain at this time.  Follow-up with your regular doctor.  Return to the emergency department if any worsening or concerning symptoms.

## 2022-08-08 NOTE — ED Notes (Signed)
Pt/family aware another xray has been ordered and then if ok plan to dc after
